# Patient Record
Sex: Female | Born: 1965 | Hispanic: No | Marital: Married | Smoking: Never smoker
Health system: Southern US, Community
[De-identification: ages and names within clinical notes are randomized; demographics above are authoritative.]

## PROBLEM LIST (undated history)

## (undated) DIAGNOSIS — M1990 Unspecified osteoarthritis, unspecified site: Secondary | ICD-10-CM

## (undated) DIAGNOSIS — F419 Anxiety disorder, unspecified: Secondary | ICD-10-CM

## (undated) DIAGNOSIS — S83206A Unspecified tear of unspecified meniscus, current injury, right knee, initial encounter: Secondary | ICD-10-CM

## (undated) HISTORY — DX: Anxiety disorder, unspecified: F41.9

---

## 1998-08-08 HISTORY — PX: AUGMENTATION MAMMAPLASTY: SUR837

## 2004-06-08 HISTORY — PX: BREAST SURGERY: SHX581

## 2005-03-09 ENCOUNTER — Other Ambulatory Visit: Admission: RE | Admit: 2005-03-09 | Discharge: 2005-03-09 | Payer: Self-pay | Admitting: Obstetrics and Gynecology

## 2006-04-25 ENCOUNTER — Other Ambulatory Visit: Admission: RE | Admit: 2006-04-25 | Discharge: 2006-04-25 | Payer: Self-pay | Admitting: Obstetrics & Gynecology

## 2007-05-09 ENCOUNTER — Other Ambulatory Visit: Admission: RE | Admit: 2007-05-09 | Discharge: 2007-05-09 | Payer: Self-pay | Admitting: Obstetrics and Gynecology

## 2007-05-23 ENCOUNTER — Other Ambulatory Visit: Admission: RE | Admit: 2007-05-23 | Discharge: 2007-05-23 | Payer: Self-pay | Admitting: Obstetrics & Gynecology

## 2008-07-08 ENCOUNTER — Other Ambulatory Visit: Admission: RE | Admit: 2008-07-08 | Discharge: 2008-07-08 | Payer: Self-pay | Admitting: Obstetrics & Gynecology

## 2009-12-30 ENCOUNTER — Encounter: Admission: RE | Admit: 2009-12-30 | Discharge: 2009-12-30 | Payer: Self-pay | Admitting: Obstetrics and Gynecology

## 2012-12-18 ENCOUNTER — Other Ambulatory Visit: Payer: Self-pay | Admitting: *Deleted

## 2012-12-18 DIAGNOSIS — Z1231 Encounter for screening mammogram for malignant neoplasm of breast: Secondary | ICD-10-CM

## 2013-01-16 ENCOUNTER — Ambulatory Visit
Admission: RE | Admit: 2013-01-16 | Discharge: 2013-01-16 | Disposition: A | Discharge status: Home / Self Care | Payer: BC Managed Care – PPO | Source: Ambulatory Visit | Attending: *Deleted | Admitting: *Deleted

## 2013-01-16 DIAGNOSIS — Z1231 Encounter for screening mammogram for malignant neoplasm of breast: Secondary | ICD-10-CM

## 2013-02-12 ENCOUNTER — Other Ambulatory Visit: Payer: Self-pay | Admitting: Orthopedic Surgery

## 2013-02-12 ENCOUNTER — Other Ambulatory Visit: Payer: BC Managed Care – PPO

## 2013-02-12 DIAGNOSIS — M79671 Pain in right foot: Secondary | ICD-10-CM

## 2013-04-11 ENCOUNTER — Ambulatory Visit (INDEPENDENT_AMBULATORY_CARE_PROVIDER_SITE_OTHER): Payer: Managed Care, Other (non HMO) | Admitting: Sports Medicine

## 2013-04-11 ENCOUNTER — Encounter: Payer: Self-pay | Admitting: Sports Medicine

## 2013-04-11 VITALS — BP 121/80 | HR 66 | Ht 67.0 in | Wt 150.0 lb

## 2013-04-11 DIAGNOSIS — M722 Plantar fascial fibromatosis: Secondary | ICD-10-CM

## 2013-04-11 NOTE — Assessment & Plan Note (Signed)
Patient physical exam as well as ultrasound does show signs of plantar fasciitis. Patient's plantar fascia did measure 0.8 cm which is twice the upper limit of normal. Arch strap given Home exercise given and discussed icing  Patient will return again in 4-6 weeks with tennis shoes and we'll watch for gait abnormalities.

## 2013-04-11 NOTE — Progress Notes (Signed)
Chief complaint: Right heel pain  History of present illness: Patient is a 47 year old female coming in with right heel pain for 2 months duration. Patient noticed it immediately while she was playing tennis and felt a strong popliteal significant swelling on the medial inferior aspect of the heel. Patient had to retire the match secondary to the pain. Patient went and saw Dr. Dion Saucier and was diagnosed with a plantar fascial rupture. Patient was put in the [facility] and told her to advance activity as tolerated. Patient is here for further evaluation and to see what activity would be possible. Patient still states that she does have a constant dull ache on the medial aspect of the ankle. Patient is able to walk without significant trouble and is able to sleep comfortably. Patient denies any numbness or tingling or any weakness. Patient would like to start running and playing tennis again when we feel that this is acceptable.   No past medical history on file. No past surgical history on file. History  Substance Use Topics  . Smoking status: Never Smoker   . Smokeless tobacco: Never Used  . Alcohol Use: Not on file   patient does drink occasionally No family history on file.  Physical exam Blood pressure 121/80, pulse 66, height 5\' 7"  (1.702 m), weight 150 lb (68.04 kg). General: No apparent distress alert and oriented x3 mood and affect normal Respiratory: Patient's speak in full sentences and does not appear short of breath Skin: Warm dry intact with no signs of infection or rash Neuro: Cranial nerves II through XII are intact, neurovascularly intact in all extremities with 2+ DTRs and 2+ pulses. Foot exam: Bilaterally patient has significant bunions on the first toe. Patient's left foot actually has fibular deviation of toes one through 4. Patient does have mild breakdown of the transverse arch bilaterally. Patient does have or pronation of the hindfoot with standing. Right PF exam Normal  inspection with no visable or palpable fat pad atrophy. Patient is tender at medial insertion of plantar fascia into calcaneus. Trace effusion Great toe motion: lacks last 10 degrees of extension.  Musculoskeletal ultrasound was performed and interpreted by me today. Patient's plantar fascia does show some hypoechoic changes and loses normal architecture near the insertion. No true tear seen.

## 2013-04-11 NOTE — Patient Instructions (Signed)
Very nice to meet you We are giving you a lot of exercises you need to do daily.  It if fine for you to do activity in good shoes but need to ice the feet 20 minutes at least 2-3 times daily.  I think tylenol and ibuprofen could be helpful if you need it.  Come back again in 4-6 weeks with your tennis/running shoes and we will look at your running and then will see if orthotics will be helpful.

## 2013-05-09 ENCOUNTER — Ambulatory Visit (INDEPENDENT_AMBULATORY_CARE_PROVIDER_SITE_OTHER): Payer: Managed Care, Other (non HMO) | Admitting: Sports Medicine

## 2013-05-09 VITALS — BP 134/85 | Ht 67.5 in | Wt 150.0 lb

## 2013-05-09 DIAGNOSIS — M21611 Bunion of right foot: Secondary | ICD-10-CM

## 2013-05-09 DIAGNOSIS — R269 Unspecified abnormalities of gait and mobility: Secondary | ICD-10-CM

## 2013-05-09 DIAGNOSIS — M21619 Bunion of unspecified foot: Secondary | ICD-10-CM

## 2013-05-09 DIAGNOSIS — M722 Plantar fascial fibromatosis: Secondary | ICD-10-CM

## 2013-05-09 NOTE — Assessment & Plan Note (Signed)
We will follow these and recheck after about 6-8 weeks to see if the orthotics are taken pressure off the great toe

## 2013-05-09 NOTE — Assessment & Plan Note (Signed)
Continue on stretches and a home exercise program  Use her arch strap standing too long  Use either custom orthotics or shoes with good arch

## 2013-05-09 NOTE — Progress Notes (Signed)
Patient ID: [name] Arroyo, female   DOB: 10-Sep-1966, 47 y.o.   MRN: [id number]  Patient with right-sided plantar fascial pain She felt a pop and was seen before seeing [place] with bruising of the heel and felt to have a partial tear When we saw her recently we noted that she had some hypoechoic change at the medial plantar fascia but generally the signs of tearing had resolved She continues to have some right heel pain but it's not severe She has been able to run up to 6 miles with walking ever 5-6 minutes for 1-2 minutes She has not returned to playing tennis because that's where she hurt her plantar fasciaShe does use an arch strap when standing or walking  On her last evaluation we noted significant bunions and hallux rigidus  For this reason we felt that she needed to be put in a custom orthotic for running and sports  She returns for these  Physical examination Patient is physically fit  Moderate tenderness is present at the insertion of the medial plantar fascia to the calcaneus on the right foot Bilateral first MTP joints show significant arthritic change with spurring Great toe extension is only 10 on the right The left great toe extension is only about 15 Flexion is slightly limited as well Standing she shows significant loss of her longitudinal arch  Walking gait shows significant pronation

## 2013-05-09 NOTE — Assessment & Plan Note (Signed)
Patient was fitted for a : standard, cushioned, semi-rigid orthotic. The orthotic was heated and afterward the patient stood on the orthotic blank positioned on the orthotic stand. The patient was positioned in subtalar neutral position and 10 degrees of ankle dorsiflexion in a weight bearing stance. After completion of molding, a stable base was applied to the orthotic blank. The blank was ground to a stable position for weight bearing. Size: 9 red EVA Base: blue EVA Posting: first ray posts Additional orthotic padding: none  We spent 45 minutes molding orthotics and then adding first ray posting to get her into a neutral position. At completion of this process she was able to run with neutral foot strike and a normal degree of pronation

## 2013-06-13 ENCOUNTER — Ambulatory Visit: Payer: Managed Care, Other (non HMO) | Admitting: Sports Medicine

## 2013-08-15 ENCOUNTER — Ambulatory Visit (INDEPENDENT_AMBULATORY_CARE_PROVIDER_SITE_OTHER): Payer: Managed Care, Other (non HMO) | Admitting: Sports Medicine

## 2013-08-15 VITALS — BP 110/76 | Ht 67.0 in | Wt 150.0 lb

## 2013-08-15 DIAGNOSIS — M76821 Posterior tibial tendinitis, right leg: Secondary | ICD-10-CM

## 2013-08-15 DIAGNOSIS — M76829 Posterior tibial tendinitis, unspecified leg: Secondary | ICD-10-CM

## 2013-08-15 MED ORDER — NITROGLYCERIN 0.2 MG/HR TD PT24: 1.0000 | MEDICATED_PATCH | Freq: Every day | TRANSDERMAL | Status: DC

## 2013-08-15 NOTE — Patient Instructions (Addendum)
Thank you for coming in today Do heel raises with toes turned in 3 x 20 daily Try the compression sleeve during the day, remove at night Nitroglycerin patch Follow-up 1 month   Nitroglycerin Protocol   Apply 1/4 nitroglycerin patch to affected area daily.  Change position of patch within the affected area every 24 hours.  You may experience a headache during the first 1-2 weeks of using the patch, these should subside.  If you experience headaches after beginning nitroglycerin patch treatment, you may take your preferred over the counter pain reliever.  Another side effect of the nitroglycerin patch is skin irritation or rash related to patch adhesive.  Please notify our office if you develop more severe headaches or rash, and stop the patch.  Tendon healing with nitroglycerin patch may require 12 to 24 weeks depending on the extent of injury.  Men should not use if taking Viagra, Cialis, or Levitra.   Do not use if you have migraines or rosacea.

## 2013-08-15 NOTE — Progress Notes (Signed)
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 She states that she previously had a partial rupture of her plantar fashion and had a lot of arch pain but that has resolved with custom orthotics. However, for the last 6 weeks she has had worsening medial ankle pain. She does continue to run and play tennis. She states that the pain in her medial ankle is worsening. In the morning she feels relatively okay but he comes more severe during the course of the day. She notes a lot of swelling over the medial ankle and also some warmth. At night the pain is throbbing in wakes her up.  ROS: As above in the HPI. All other systems are stable or negative.  OBJECTIVE: APPEARANCE:  Patient in no acute distress.The patient appeared well nourished and normally developed. HEENT: No scleral icterus. Conjunctiva non-injected Resp: Non labored Skin: No rash MSK:  Right Ankle: No visible erythema or swelling. Range of motion is full in all directions. Strength is 5/5 in all directions. Tenderness over the posterior tibial tendon with some mild puffiness in this area Pain with resisted plantarflexion and eversion Normal posterior tib function on heel raise Stable lateral and medial ligaments; squeeze test and kleiger test unremarkable; Talar dome nontender; No pain at base of 5th MT; No tenderness over cuboid; Able to walk 4 steps.   MSK [place]: Limited ultrasound of the medial ankle was performed in transverse and longitudinal views. There was noted to be hypoechoic changes surrounding the posterior tibialis tendon. Also noted is an accessory flexor digitorum which could potentially be causing crowding in the tarsal tunnel. There is some hypoechoic change around this tendon as well. On longitudinal views following the posterior tibialis tendon to the attachment site at the navicular there is a  hyperechoic fragment off of the distal navicular consistent with a probable avulsion. This is present on both transverse and longitudinal views. Again noted is the extensor digitorum in this place with some hypoechoic change around the area. There is increased signal on Doppler at the area of the navicular and posterior tibialis insertion.   ASSESSMENT: #1. 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 She was given exercises including heel raises with foot inversion. We'll place her in a body helix ankle sleeve to wear during the day. She may continue to do her sporting activities as tolerated. She does seem to have good correction of her foot position in her custom orthotics and she will continue these. Followup in one month.

## 2013-08-28 ENCOUNTER — Ambulatory Visit: Payer: Managed Care, Other (non HMO) | Admitting: Sports Medicine

## 2013-09-13 ENCOUNTER — Encounter: Payer: Self-pay | Admitting: Sports Medicine

## 2013-09-13 ENCOUNTER — Ambulatory Visit (INDEPENDENT_AMBULATORY_CARE_PROVIDER_SITE_OTHER): Payer: Managed Care, Other (non HMO) | Admitting: Sports Medicine

## 2013-09-13 VITALS — BP 125/80 | Ht 67.5 in | Wt 150.0 lb

## 2013-09-13 DIAGNOSIS — M21619 Bunion of unspecified foot: Secondary | ICD-10-CM

## 2013-09-13 DIAGNOSIS — R269 Unspecified abnormalities of gait and mobility: Secondary | ICD-10-CM

## 2013-09-13 DIAGNOSIS — M25559 Pain in unspecified hip: Secondary | ICD-10-CM

## 2013-09-13 DIAGNOSIS — M21611 Bunion of right foot: Secondary | ICD-10-CM

## 2013-09-13 DIAGNOSIS — M25569 Pain in unspecified knee: Secondary | ICD-10-CM

## 2013-09-13 DIAGNOSIS — M2550 Pain in unspecified joint: Secondary | ICD-10-CM

## 2013-09-13 DIAGNOSIS — G8929 Other chronic pain: Secondary | ICD-10-CM | POA: Insufficient documentation

## 2013-09-13 NOTE — Assessment & Plan Note (Signed)
Based on her examination I doubt that we will find significant changes to suggest rheumatoid arthritis  However, with her positive family history I think we will go ahead and screen with a CBC, sedimentation rate, rheumatoid factor and ANA  She is okay to keep up her activity

## 2013-09-13 NOTE — Assessment & Plan Note (Signed)
Gait seems to be improving and she has less posterior tibialis pain today as well

## 2013-09-13 NOTE — Progress Notes (Signed)
  Subjective:    Patient ID: [name] Arroyo, female    DOB: 15-Nov-1965, 47 y.o.   MRN: [id number]  HPI  Pt presents to clinic for f/u of posterior tibialis tendinopathy with small avulsion off navicular which is improving.  Used NTG sparingly.  Doing home exercises regularly.  Playing tennis and running without significant pain.  Her main concern today is various joint pains that she is having in wrists, ankles, knees.  She does notice soreness in joints especially in the mornings, or after a period of rest.  Family hx of Rheumatoid Arthritis, Myesthenia Gravis, and MS   Since both her hands and wrists 8 most morning she wonders about checking for possible rheumatoid arthritis  Review of Systems     Objective:   Physical Exam  No acute distress  Post tib area looks better today, no swelling  Normal ankle ligaments  Hands Normal motion No effusion No MCP swelling Has changes at MCP of bilat thumbs Flexor nodule on flexor tendons on index finger on lt and 3rd finger on rt  Normal ankle and knee motion bilat No swelling in ankles and knees  Bilateral early bunions bilat Loss of long arch       Assessment & Plan:

## 2013-09-13 NOTE — Assessment & Plan Note (Signed)
Improvement of symptoms using orthotics

## 2013-10-16 ENCOUNTER — Telehealth: Payer: Self-pay | Admitting: Obstetrics & Gynecology

## 2013-10-16 NOTE — Telephone Encounter (Signed)
Pt is calling to ask a question for her daughter. She states her daughter is away at college and her roommate was just diagnosed with Type 1 Herpes. Her daughter has drank from her friends cup and now she is concerned that she may be at risk for herpes.

## 2013-10-16 NOTE — Telephone Encounter (Signed)
Spoke with pt who wants to find out more about HSV 1 to hopefully allay daughter's concerns. Daughter has never been sexually active, has never had a cold sore or any genital sore. Daughter's  friend has recently been diagnosed with HSV1 and had a "genital outbreak for the first time."  Pt's daughter has shared a soda with her friend and is worried. Friend has not had a sore on her mouth. Pt just wanted to know the risks and how long it takes for any exposure to show up on a titer test, if daughter was to have it done. Tried to reassure pt that risk is very low, especially since friend did not even have a cold sore at the time they shared a soda.  Any other recommendation?

## 2013-10-17 NOTE — Telephone Encounter (Signed)
Called patient.  Answered all questions regarding HSV1, genital warts.  Informed pt this is genital warts not facial herpes.  Spread by skin to skin contact only. Very grateful for phone call. Encounter closed.

## 2013-12-18 ENCOUNTER — Encounter: Payer: Self-pay | Admitting: Sports Medicine

## 2013-12-18 ENCOUNTER — Ambulatory Visit (INDEPENDENT_AMBULATORY_CARE_PROVIDER_SITE_OTHER): Payer: Managed Care, Other (non HMO) | Admitting: Sports Medicine

## 2013-12-18 VITALS — BP 105/73 | HR 67 | Ht 67.0 in | Wt 150.0 lb

## 2013-12-18 DIAGNOSIS — G5762 Lesion of plantar nerve, left lower limb: Secondary | ICD-10-CM

## 2013-12-18 DIAGNOSIS — G5760 Lesion of plantar nerve, unspecified lower limb: Secondary | ICD-10-CM

## 2013-12-18 DIAGNOSIS — M25579 Pain in unspecified ankle and joints of unspecified foot: Secondary | ICD-10-CM

## 2013-12-18 NOTE — Progress Notes (Signed)
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 She states she has always had some level of discomfort is result of her severe bunion. However her pain has been different for the last month. She notes increased forefoot pain and swelling over both the dorsal and plantar aspect of her foot. She feels like she is walking on a rock. She gets pain that shoots into her toes and also has numbness. She thinks she has a Morton's neuroma. She denies any change in training patterns. She is running about 20 miles a week.  ROS: As above in the HPI. All other systems are stable or negative.  OBJECTIVE: APPEARANCE:  Patient in no acute distress.The patient appeared well nourished and normally developed. HEENT: No scleral icterus. Conjunctiva non-injected Resp: Non labored Skin: No rash MSK:  Foot exam:  - On inspection, there is noted to be mild puffiness of the dorsal forefoot on the left.  - With barefoot walking patient is noted to have antalgic gait.  - There is tenderness to palpation over the third distal metatarsal greater than second distal metatarsal but no other tenderness to palpation.  - Pain with metatarsal squeeze - Strength is 5 out of 5 in ankle and foot.  - Neurovascular status normal.   MSK US: Limited ultrasound of the right foot was performed today. Second and third metatarsals visualized in transverse and longitudinal view with no evidence of disruption of the cortex or surrounding hypoechoic change to suggest stress fracture. I could not confidently visualized a Morton's neuroma today.   ASSESSMENT: #1. Left forefoot pain consistent with Morton's neuroma in the setting of severe bunion   PLAN: We again discussed with her that we will not recommend that she have surgical correction of the bunion until her pain becomes severe enough that she is unable to participate in her normal activities.  For her neuroma, the best option at this point is to try a metatarsal pad. A medium metatarsal pad was added to her left shoe. She will return to see us in 3 weeks. If she is still having significant symptoms at that time would consider injection. We would be happy to recommend a foot surgeon in the future if her bunion becomes painful enough that she is ready to move forward with surgery. Followup 3 weeks.

## 2013-12-18 NOTE — Patient Instructions (Signed)
Thank you for coming in today  We think you have a mortons neuroma Try metatarsal pad We will try injection if this does not help Get foot xrays  Followup 3 weeks

## 2013-12-28 ENCOUNTER — Ambulatory Visit
Admission: RE | Admit: 2013-12-28 | Discharge: 2013-12-28 | Disposition: A | Discharge status: Home / Self Care | Payer: Managed Care, Other (non HMO) | Source: Ambulatory Visit | Attending: Family Medicine | Admitting: Family Medicine

## 2013-12-28 DIAGNOSIS — M25579 Pain in unspecified ankle and joints of unspecified foot: Secondary | ICD-10-CM

## 2013-12-28 DIAGNOSIS — G5762 Lesion of plantar nerve, left lower limb: Secondary | ICD-10-CM

## 2014-01-07 ENCOUNTER — Encounter: Payer: Self-pay | Admitting: Sports Medicine

## 2014-01-07 ENCOUNTER — Ambulatory Visit (INDEPENDENT_AMBULATORY_CARE_PROVIDER_SITE_OTHER): Payer: Managed Care, Other (non HMO) | Admitting: Sports Medicine

## 2014-01-07 VITALS — BP 127/82 | HR 65 | Ht 67.0 in | Wt 150.0 lb

## 2014-01-07 DIAGNOSIS — G5760 Lesion of plantar nerve, unspecified lower limb: Secondary | ICD-10-CM

## 2014-01-07 DIAGNOSIS — M21611 Bunion of right foot: Secondary | ICD-10-CM

## 2014-01-07 DIAGNOSIS — G5762 Lesion of plantar nerve, left lower limb: Secondary | ICD-10-CM

## 2014-01-07 DIAGNOSIS — M21612 Bunion of left foot: Secondary | ICD-10-CM

## 2014-01-07 DIAGNOSIS — M21619 Bunion of unspecified foot: Secondary | ICD-10-CM

## 2014-01-07 NOTE — Patient Instructions (Signed)
You have been scheduled for an appointment with Dr. Victorino DikeHewitt on 02/18/14 at 8:15 am.   [facility] [address] [place] [id number] [phone]

## 2014-01-07 NOTE — Progress Notes (Signed)
   Subjective:    Patient ID: [name] Arroyo, female    DOB: May 11, 1966, 48 y.o.   MRN: [id number]  HPI Patient comes in today for followup on left foot pain. Overall, symptoms are the same despite using a metatarsal pad. X-rays of her left foot show a rather prominent hallux valgus deformity with bunion formation. Nothing acute. She continues to complain of pain across her forefoot. Numbness  Into the second toe as well.    Review of Systems     Objective:   Physical Exam Well-developed, well-nourished. No acute distress.  Left foot: There is tenderness to palpation between the second and third metatarsal heads. No palpable neuroma. No tenderness over the metatarsal head. No tenderness to palpation across the dorsum of the foot. No pain with metatarsal squeeze. No soft tissue swelling. Once again appreciated is a prominent bunion of the great toe. Patient walks without a limp.  X-rays are as above      Assessment & Plan:  Chronic left foot pain secondary to bunion deformity now with probable Morton's neuroma  Patient is interested in discussing surgical options for her foot. This is a chronic issue for her and she is frustrated by the disability that is causing her. I've arranged for consultation with Dr. Candice CampHewiltt to take place the first week of April. Patient will be leaving on March 16 [place] and I discussed the possibility of a cortisone injection for her possible neuroma if symptoms warrant. If so, she will return to the office approximately one week prior to her trip for the injection. Otherwise, I will defer further workup and treatment of her left foot issue to Dr.Hewitt.

## 2014-01-09 ENCOUNTER — Other Ambulatory Visit: Payer: Self-pay | Admitting: Nurse Practitioner

## 2014-01-09 NOTE — Telephone Encounter (Signed)
Pt needs to schedule her Annual exam

## 2014-01-09 NOTE — Telephone Encounter (Signed)
Pt needs to schedule Annual Exam

## 2014-02-06 ENCOUNTER — Encounter: Payer: Self-pay | Admitting: Sports Medicine

## 2014-02-06 ENCOUNTER — Ambulatory Visit (INDEPENDENT_AMBULATORY_CARE_PROVIDER_SITE_OTHER): Payer: Managed Care, Other (non HMO) | Admitting: Sports Medicine

## 2014-02-06 VITALS — BP 121/77 | Ht 67.0 in | Wt 150.0 lb

## 2014-02-06 DIAGNOSIS — G5762 Lesion of plantar nerve, left lower limb: Secondary | ICD-10-CM

## 2014-02-06 DIAGNOSIS — G5760 Lesion of plantar nerve, unspecified lower limb: Secondary | ICD-10-CM

## 2014-02-06 MED ORDER — METHYLPREDNISOLONE ACETATE 40 MG/ML IJ SUSP
40.0000 mg | Freq: Once | INTRAMUSCULAR | Status: AC
  Administered 2014-02-06: 40 mg via INTRALESIONAL

## 2014-02-06 NOTE — Progress Notes (Signed)
   Subjective:    Patient ID: [name] Arroyo, female    DOB: 11-21-1965, 48 y.o.   MRN: [id number]  HPI Patient is here for a cortisone injection into her left foot. She was previously diagnosed with a Morton's neuroma of the left foot. This is a clinical diagnosis. She is having pain and swelling in between the second and third metatarsal heads. Swelling is both dorsal and medial and is associated with numbness and tingling into her toes. She was previously fitted with a metatarsal pad which helps somewhat but did not result in complete symptom relief. She has an appointment with Dr.Hewitt later this month. We had discussed previously doing a cortisone injection if her symptoms warranted.    Review of Systems     Objective:   Physical Exam Well-developed, well-nourished. No acute distress. Awake alert and oriented x3. Vital signs reviewed.  Left foot: Once again noted is complete collapse of the transverse arch with a bunion deformity. There is soft tissue swelling between the metatarsal heads on the dorsum of the foot (second and third metatarsal heads). Tenderness to palpation and a positive Mulder sign. She also has some mild soft tissue swelling along the plantar aspect of her foot, typically over the second metatarsal head. Minimal tenderness to palpation here however. There is noticeable callus formation. She is neurovascularly intact distally and is walking without a limp.       Assessment & Plan:  1. Left foot pain secondary to Morton's neuroma 2. Metatarsalgia 3. Bunion deformity 4. Transverse arch collapse  For diagnostic as well as therapeutic reasons I injected the Morton's neuroma in her left foot utilizing a dorsal approach. Risks and benefits of the procedure were explained prior to injection including the risk of hypopigmentation. Patient tolerated this without difficulty. She will continue to wear her sports insoles with metatarsal pads in a shoe with a wide toe box and  will see Dr.Hewitt as scheduled later this month. Followup with me when necessary.  Consent obtained and verified. Time-out conducted. Noted no overlying erythema, induration, or other signs of local infection. Skin prepped in a sterile fashion. Topical analgesic spray: Ethyl chloride. Joint: left foot mortons neuroma (2nd webspace between 2nd and 3rd metatarsal heads) Needle: 25g 1.5 inch Completed without difficulty. Meds: 1cc depomedrol (40mg ), 1cc 1% xylocaine  Advised to call if fevers/chills, erythema, induration, drainage, or persistent bleeding.

## 2014-02-08 ENCOUNTER — Other Ambulatory Visit: Payer: Self-pay | Admitting: Certified Nurse Midwife

## 2014-02-11 ENCOUNTER — Other Ambulatory Visit: Payer: Self-pay | Admitting: Certified Nurse Midwife

## 2014-02-11 NOTE — Telephone Encounter (Signed)
AEX schedule for 03/04/14 @ 9:00 with PG Ocella #28/0refills sent in to last patient until AEX

## 2014-02-12 NOTE — Telephone Encounter (Signed)
Last AEX 12/2012 AEX appt scheduled for 03/04/2014 Last refill 01/09/2014 #30/0 refills.  Please approve or deny Rx.

## 2014-02-28 ENCOUNTER — Encounter: Payer: Self-pay | Admitting: Nurse Practitioner

## 2014-03-04 ENCOUNTER — Ambulatory Visit: Payer: Self-pay | Admitting: Nurse Practitioner

## 2014-03-11 ENCOUNTER — Telehealth: Payer: Self-pay | Admitting: Nurse Practitioner

## 2014-03-11 NOTE — Telephone Encounter (Signed)
[facility]  Refill needed for birth control. AEX 04/08/14.

## 2014-03-12 ENCOUNTER — Ambulatory Visit: Payer: Self-pay | Admitting: Nurse Practitioner

## 2014-03-12 MED ORDER — DROSPIRENONE-ETHINYL ESTRADIOL 3-0.03 MG PO TABS: 1.0000 | ORAL_TABLET | Freq: Every day | ORAL | Status: DC

## 2014-03-12 NOTE — Telephone Encounter (Signed)
Last AEX 12/2012 Last refill 02/11/2014 #28/0    Will refill once until appt 04/2014  - Patient notified.

## 2014-03-13 ENCOUNTER — Other Ambulatory Visit: Payer: Self-pay | Admitting: Certified Nurse Midwife

## 2014-03-13 NOTE — Telephone Encounter (Signed)
Deb please see request for refill on Zoloft

## 2014-03-13 NOTE — Telephone Encounter (Signed)
Last AEX 12/2012  Last refill 02/11/14 #30/0 refills.  DL - No more refills after this one unless seen in office  Next appt 04/08/14  Please approve or deny Rx.

## 2014-03-14 ENCOUNTER — Other Ambulatory Visit: Payer: Self-pay

## 2014-03-14 DIAGNOSIS — Z1231 Encounter for screening mammogram for malignant neoplasm of breast: Secondary | ICD-10-CM

## 2014-03-17 NOTE — Telephone Encounter (Signed)
Deb see refill request for Zoloft- you may have already done it

## 2014-04-08 ENCOUNTER — Ambulatory Visit (INDEPENDENT_AMBULATORY_CARE_PROVIDER_SITE_OTHER): Payer: Managed Care, Other (non HMO) | Admitting: Nurse Practitioner

## 2014-04-08 ENCOUNTER — Encounter: Payer: Self-pay | Admitting: Nurse Practitioner

## 2014-04-08 ENCOUNTER — Ambulatory Visit
Admission: RE | Admit: 2014-04-08 | Discharge: 2014-04-08 | Disposition: A | Discharge status: Home / Self Care | Payer: Managed Care, Other (non HMO) | Source: Ambulatory Visit

## 2014-04-08 VITALS — BP 120/84 | HR 56 | Resp 16

## 2014-04-08 DIAGNOSIS — M2550 Pain in unspecified joint: Secondary | ICD-10-CM

## 2014-04-08 DIAGNOSIS — Z1231 Encounter for screening mammogram for malignant neoplasm of breast: Secondary | ICD-10-CM

## 2014-04-08 DIAGNOSIS — Z01419 Encounter for gynecological examination (general) (routine) without abnormal findings: Secondary | ICD-10-CM

## 2014-04-08 DIAGNOSIS — Z0000 Encounter for general adult medical examination without abnormal findings: Secondary | ICD-10-CM

## 2014-04-08 LAB — COMPREHENSIVE METABOLIC PANEL
ALK PHOS: 54 U/L (ref 39–117)
ALT: 13 U/L (ref 0–35)
AST: 24 U/L (ref 0–37)
Albumin: 4 g/dL (ref 3.5–5.2)
BILIRUBIN TOTAL: 0.6 mg/dL (ref 0.2–1.2)
BUN: 14 mg/dL (ref 6–23)
CALCIUM: 9.6 mg/dL (ref 8.4–10.5)
CO2: 28 meq/L (ref 19–32)
Chloride: 101 mEq/L (ref 96–112)
Creat: 0.9 mg/dL (ref 0.50–1.10)
GLUCOSE: 98 mg/dL (ref 70–99)
POTASSIUM: 5 meq/L (ref 3.5–5.3)
SODIUM: 136 meq/L (ref 135–145)
Total Protein: 6.6 g/dL (ref 6.0–8.3)

## 2014-04-08 LAB — LIPID PANEL
CHOLESTEROL: 247 mg/dL — AB (ref 0–200)
HDL: 91 mg/dL (ref >39)
LDL Cholesterol: 136 mg/dL — ABNORMAL HIGH (ref 0–99)
TRIGLYCERIDES: 99 mg/dL (ref <150)
Total CHOL/HDL Ratio: 2.7 Ratio
VLDL: 20 mg/dL (ref 0–40)

## 2014-04-08 LAB — SEDIMENTATION RATE: SED RATE: 1 mm/h (ref 0–22)

## 2014-04-08 LAB — POCT URINALYSIS DIPSTICK
Bilirubin, UA: NEGATIVE
Blood, UA: NEGATIVE
GLUCOSE UA: NEGATIVE
Ketones, UA: NEGATIVE
LEUKOCYTES UA: NEGATIVE
NITRITE UA: NEGATIVE
PH UA: 8
Protein, UA: NEGATIVE
Urobilinogen, UA: NEGATIVE

## 2014-04-08 LAB — RHEUMATOID FACTOR

## 2014-04-08 LAB — TSH: TSH: 2.752 u[IU]/mL (ref 0.350–4.500)

## 2014-04-08 MED ORDER — DROSPIRENONE-ETHINYL ESTRADIOL 3-0.03 MG PO TABS: 1.0000 | ORAL_TABLET | Freq: Every day | ORAL | Status: DC

## 2014-04-08 NOTE — Progress Notes (Signed)
Patient ID: [name] Arroyo, female   DOB: 08-Jan-1966, 48 y.o.   MRN: [id number] 48 y.o. [id number] Married Caucasian Fe here for annual exam.  She will be having left foot surgery this fall with extensive repair of bunion and 2 & 3 rd metatarsal.  Needs labs for Dr.Hewitt at [facility]. Menses are normal and last for 3-4 days.  Patient's last menstrual period was 04/02/2014.          Sexually active: yes  The current method of family planning is OCP (estrogen/progesterone).    Exercising: yes  running, tennis and walking.  Pt has recently started a new 30 day challenge. Smoker:  no  Health Maintenance: Pap:  12/16/11, WNL, neg HR HPV MMG:  01/18/13, Bi-Rads 1: negative 0- scheduled today TDaP:  07/08/08 Labs:  HB:  13.4  Urine:   Negative yellow   reports that she has never smoked. She has never used smokeless tobacco. She reports that she drinks about 3.5 - 7 ounces of alcohol per week. She reports that she does not use illicit drugs.  History reviewed. No pertinent past medical history.  Past Surgical History  Procedure Laterality Date  . Augmentation mammaplasty Bilateral 10/99    revision 8/05    Current Outpatient Prescriptions  Medication Sig Dispense Refill  . drospirenone-ethinyl estradiol (OCELLA) 3-0.03 MG tablet Take 1 tablet by mouth daily.  3 Package  3  . sertraline (ZOLOFT) 50 MG tablet TAKE 1/2-1 TABLET BY MOUTH DAILY  30 tablet  0   No current facility-administered medications for this visit.    Family History  Problem Relation Age of Onset  . Hyperlipidemia Mother   . Hyperlipidemia Father   . Colon cancer Paternal Grandfather     ROS:  Pertinent items are noted in HPI.  Otherwise, a comprehensive ROS was negative.  Exam:   BP 120/84  Pulse 56  Resp 16  LMP 04/02/2014    Ht Readings from Last 3 Encounters:  02/06/14 5\' 7"  (1.702 m)  01/07/14 5\' 7"  (1.702 m)  12/18/13 5\' 7"  (1.702 m)    General appearance: alert, cooperative and appears stated  age Head: Normocephalic, without obvious abnormality, atraumatic Neck: no adenopathy, supple, symmetrical, trachea midline and thyroid normal to inspection and palpation Lungs: clear to auscultation bilaterally Breasts: normal appearance, no masses or tenderness, positive findings: implants bilaterally Heart: regular rate and rhythm Abdomen: soft, non-tender; no masses,  no organomegaly Extremities: extremities normal, atraumatic, no cyanosis or edema Skin: Skin color, texture, turgor normal. No rashes or lesions Lymph nodes: Cervical, supraclavicular, and axillary nodes normal. No abnormal inguinal nodes palpated Neurologic: Grossly normal   Pelvic: External genitalia:  no lesions              Urethra:  normal appearing urethra with no masses, tenderness or lesions              Bartholin's and Skene's: normal                 Vagina: normal appearing vagina with normal color and discharge, no lesions              Cervix: anteverted              Pap taken: no Bimanual Exam:  Uterus:  normal size, contour, position, consistency, mobility, non-tender              Adnexa: no mass, fullness, tenderness  Rectovaginal: Confirms               Anus:  normal sphincter tone, no lesions  A:  Well Woman with normal exam  Contraception   S/P breast augmentation  Labs per request  P:   Reviewed health and wellness pertinent to exam  Pap smear not taken today  Mammogram is due today at noon  Refill on Ocella for a year  Counseled on breast self exam, mammography screening, use and side effects of OCP's, adequate intake of calcium and vitamin D, diet and exercise return annually or prn  An After Visit Summary was printed and given to the patient.

## 2014-04-08 NOTE — Patient Instructions (Signed)

## 2014-04-09 LAB — HEMOGLOBIN, FINGERSTICK: HEMOGLOBIN, FINGERSTICK: 13.4 g/dL (ref 12.0–16.0)

## 2014-04-10 ENCOUNTER — Other Ambulatory Visit: Payer: Self-pay | Admitting: Certified Nurse Midwife

## 2014-04-10 NOTE — Telephone Encounter (Signed)
AEX 04/08/14 Last refill 03/14/14 #30/0 refills  Please approve or deny Rx.

## 2014-04-10 NOTE — Telephone Encounter (Signed)
Patty please review for consent to refill

## 2014-04-12 NOTE — Progress Notes (Signed)
Encounter reviewed by Dr. Arisa Congleton Silva.  

## 2014-07-30 HISTORY — PX: FOOT SURGERY: SHX648

## 2014-09-09 ENCOUNTER — Encounter: Payer: Self-pay | Admitting: Nurse Practitioner

## 2014-10-25 ENCOUNTER — Ambulatory Visit (INDEPENDENT_AMBULATORY_CARE_PROVIDER_SITE_OTHER): Payer: Managed Care, Other (non HMO) | Admitting: Sports Medicine

## 2014-10-25 ENCOUNTER — Encounter: Payer: Self-pay | Admitting: Sports Medicine

## 2014-10-25 VITALS — BP 112/63 | Ht 67.0 in | Wt 150.0 lb

## 2014-10-25 DIAGNOSIS — M722 Plantar fascial fibromatosis: Secondary | ICD-10-CM

## 2014-10-25 DIAGNOSIS — M21612 Bunion of left foot: Secondary | ICD-10-CM

## 2014-10-25 DIAGNOSIS — R269 Unspecified abnormalities of gait and mobility: Secondary | ICD-10-CM

## 2014-10-25 DIAGNOSIS — M2012 Hallux valgus (acquired), left foot: Secondary | ICD-10-CM

## 2014-10-25 DIAGNOSIS — M21611 Bunion of right foot: Secondary | ICD-10-CM

## 2014-10-25 DIAGNOSIS — M2011 Hallux valgus (acquired), right foot: Secondary | ICD-10-CM

## 2014-10-25 NOTE — Progress Notes (Signed)
  [name] Arroyo - [age] y.o. female MRN [id number]  Date of birth: 09/10/1966  SUBJECTIVE:  Including CC & ROS.  Patient is a 48 year old Caucasian female who presents today for gait analysis and orthotics for chronic foot pain. Patient has a history of bilateral bunions that was surgically corrected on her left foot along with fusion of her second toe. Patient reports her operation occurred in September. She currently has been weightbearing for the last several weeks and eager to start returning to running in the future. Patient also reported that during her surgery the tendon that it controls her left great toe and extension was severed and thus she has loss of full extension. Patient denies any significant pain in her foot currently does have some residual swelling since her surgery.   ROS: Review of systems otherwise negative except for information present in HPI  HISTORY: Past Medical, Surgical, Social, and Family History Reviewed & Updated per EMR. Pertinent Historical Findings include: Recent bunionectomy Gait abnormalities History plantar fasciitis  PHYSICAL EXAM:  VS: BP:112/63 mmHg  HR: bpm  TEMP: ( )  RESP:   HT:5\' 7"  (170.2 cm)   WT:150 lb (68.04 kg)  BMI:23.5 FOOT EXAM:  General: well nourished Skin of LE: warm; dry, no rashes, lesions, ecchymosis or erythema. Vascular: Dorsal pedal pulses 2+ bilaterally Neurologically: Sensation to light touch lower extremities equal and intact bilaterally.  Observation - no ecchymosis, no edema, or hematoma present  Palpation: No TTP Normal ankle motion and strength bilaterally  Extension/flexion 5/5 strength bilaterally in toes Weight-bearing foot exam:  First ray: Correction of hallux valgus left mild persistent hallux valgus on the right  Second ray: Second toe on the left has been fused in rigid, on the right foot slightly longer than the first ray Longitudinal arch: Collapse of medial arch Heel position: Varus No leg length  discrepancy Gait analysis:  Striking location: Heel strike with walking Foot motion: Pronation Knee motion: Neutral Hip motion: Neutral  ASSESSMENT & PLAN: See problem based charting & AVS for pt instructions. Orthotic Fitting and Adjustment note: Patient was fitted for a : standard, cushioned, semi-rigid orthotic.  The orthotic was heated and afterward the patient stood on the orthotic blank positioned on the orthotic stand.  The patient was positioned in subtalar neutral position and 10 degrees of ankle dorsiflexion in a weight bearing stance.  After completion of molding, a stable base was applied to the orthotic blank.  The blank was ground to a stable position for weight bearing.  Size: 8 Base: Blue EVA Posting: Bilateral first ray post Additional orthotic padding: none  Greater than 50% of the patient's visit for a total of 30 minutes, was spent conducting face-to-face counseling for bilateral foot orthotics fitting and constructing

## 2014-12-03 ENCOUNTER — Ambulatory Visit: Payer: Managed Care, Other (non HMO) | Admitting: Sports Medicine

## 2015-03-14 ENCOUNTER — Other Ambulatory Visit: Payer: Self-pay | Admitting: Nurse Practitioner

## 2015-03-14 NOTE — Telephone Encounter (Signed)
Needs AEX 

## 2015-03-14 NOTE — Telephone Encounter (Signed)
Medication refill request: Ocella  Last AEX:  04/08/14 with PG  Next AEX: No AEX scheduled  Last MMG (if hormonal medication request): 04/08/14 Bi-Rads 2: Benign Refill authorized: Please advise.

## 2015-03-24 ENCOUNTER — Other Ambulatory Visit: Payer: Self-pay

## 2015-03-24 DIAGNOSIS — Z1231 Encounter for screening mammogram for malignant neoplasm of breast: Secondary | ICD-10-CM

## 2015-03-25 ENCOUNTER — Encounter: Payer: Self-pay | Admitting: Sports Medicine

## 2015-03-25 ENCOUNTER — Ambulatory Visit (INDEPENDENT_AMBULATORY_CARE_PROVIDER_SITE_OTHER): Payer: Managed Care, Other (non HMO) | Admitting: Sports Medicine

## 2015-03-25 VITALS — BP 117/54 | HR 72 | Ht 67.0 in | Wt 155.0 lb

## 2015-03-25 DIAGNOSIS — M2012 Hallux valgus (acquired), left foot: Secondary | ICD-10-CM | POA: Diagnosis not present

## 2015-03-25 DIAGNOSIS — M722 Plantar fascial fibromatosis: Secondary | ICD-10-CM

## 2015-03-25 DIAGNOSIS — R269 Unspecified abnormalities of gait and mobility: Secondary | ICD-10-CM

## 2015-03-25 DIAGNOSIS — M21611 Bunion of right foot: Secondary | ICD-10-CM

## 2015-03-25 DIAGNOSIS — M2011 Hallux valgus (acquired), right foot: Secondary | ICD-10-CM

## 2015-03-25 DIAGNOSIS — M21612 Bunion of left foot: Secondary | ICD-10-CM

## 2015-03-25 NOTE — Progress Notes (Signed)
Patient ID: [name] Arroyo, female   DOB: 09/12/1966, 49 y.o.   MRN: [id number]  BP 117/54 mmHg  Pulse 72  Ht 5\' 7"  (1.702 m)  Wt 155 lb (70.308 kg)  BMI 24.27 kg/m2  History: Had left foot surgery last September 2015 - by Dr Victorino DikeHewitt I think this has gone well with rapid healing considering degree of surgery Bunionectomy and plantar plate with screws Having some medial arch and pain in the dorsum of TMT 1 Some swelling at night and after workouts Using compression socks some Did a 4mi run/walk program yesterday  Exam: NAD BP 117/54 mmHg  Pulse 72  Ht 5\' 7"  (1.702 m)  Wt 155 lb (70.308 kg)  BMI 24.27 kg/m2   Ankle: No visible erythema or swelling. Range of motion is full in all directions. Strength is 5/5 in all directions. Stable lateral and medial ligaments; squeeze test and kleiger test unremarkable; Talar dome nontender; No pain at base of 5th MT; No tenderness over cuboid; No tenderness over N spot or navicular prominence No tenderness on posterior aspects of lateral and medial malleolus No sign of peroneal tendon subluxations; Negative tarsal tunnel tinel's   Able to walk 4 steps. Foot only has mild swelling Valgus alignment is only 10 deg on left, with shortening of left first toe Scar is healed and there are 3 palpable screws  US  The first MT looked good as does MTP 1 MTP 2 has slight dorsal swelling There is limited swelling around 2 screws on MTP 1 Everything looks intact and I do not see excessive scar tissue formation with normal appearance of soft tissue x directly around screws

## 2015-03-25 NOTE — Assessment & Plan Note (Signed)
I reassured her that I thought this was healing well  There is not excessive scar tissue  Localized swelling only over screws and this is less than 24 hrs after running  I would continue gradual return to activity  I added some scaphoid padding to orthotic today to unload a bit more pressure over MTP 1  Also find some compression socks that are comfortable and that may lessen swelling  OK to cont qod run/walk program

## 2015-04-11 ENCOUNTER — Other Ambulatory Visit: Payer: Self-pay | Admitting: Nurse Practitioner

## 2015-04-11 NOTE — Telephone Encounter (Signed)
Medication refill request: Ocella  Last AEX:  04/08/14 with PG  Next AEX: 06/02/15 with PG  Last MMG (if hormonal medication request): 04/08/14 bi-rads 1: negative ; scheduled for 06/02/15. Refill authorized: #28/1 rfs, please advise.  (routed to Ms. Debbie since Ms. Alexia Freestoneatty is out of the office)

## 2015-04-13 ENCOUNTER — Other Ambulatory Visit: Payer: Self-pay | Admitting: Nurse Practitioner

## 2015-04-14 NOTE — Telephone Encounter (Signed)
04/11/15 #28/1 rf was sent to [facility].

## 2015-05-07 ENCOUNTER — Other Ambulatory Visit: Payer: Self-pay | Admitting: Nurse Practitioner

## 2015-05-07 NOTE — Telephone Encounter (Signed)
Medication refill request: Zoloft, Ocella  Last AEX:  04/08/14 PG Next AEX: 07/16/15 PG Last MMG (if hormonal medication request): 04/08/14 BIRADS2:Benign . Has appt 06/02/15  Refill authorized: Ocella 04/11/15 #28tabs /1R. Zoloft 03/14/14 #30tabs/0R. Today please advise.

## 2015-06-02 ENCOUNTER — Ambulatory Visit: Payer: Managed Care, Other (non HMO) | Admitting: Nurse Practitioner

## 2015-07-16 ENCOUNTER — Ambulatory Visit (INDEPENDENT_AMBULATORY_CARE_PROVIDER_SITE_OTHER): Payer: Managed Care, Other (non HMO) | Admitting: Obstetrics and Gynecology

## 2015-07-16 ENCOUNTER — Encounter: Payer: Self-pay | Admitting: Obstetrics and Gynecology

## 2015-07-16 ENCOUNTER — Ambulatory Visit: Payer: Managed Care, Other (non HMO) | Admitting: Nurse Practitioner

## 2015-07-16 VITALS — BP 110/64 | HR 64 | Resp 14 | Ht 66.5 in | Wt 156.0 lb

## 2015-07-16 DIAGNOSIS — R631 Polydipsia: Secondary | ICD-10-CM

## 2015-07-16 DIAGNOSIS — Z8639 Personal history of other endocrine, nutritional and metabolic disease: Secondary | ICD-10-CM

## 2015-07-16 DIAGNOSIS — N3281 Overactive bladder: Secondary | ICD-10-CM | POA: Diagnosis not present

## 2015-07-16 DIAGNOSIS — F411 Generalized anxiety disorder: Secondary | ICD-10-CM | POA: Diagnosis not present

## 2015-07-16 DIAGNOSIS — Z01419 Encounter for gynecological examination (general) (routine) without abnormal findings: Secondary | ICD-10-CM

## 2015-07-16 DIAGNOSIS — Z0000 Encounter for general adult medical examination without abnormal findings: Secondary | ICD-10-CM | POA: Diagnosis not present

## 2015-07-16 DIAGNOSIS — Z124 Encounter for screening for malignant neoplasm of cervix: Secondary | ICD-10-CM

## 2015-07-16 LAB — POCT URINALYSIS DIPSTICK
BILIRUBIN UA: NEGATIVE
Blood, UA: NEGATIVE
GLUCOSE UA: NEGATIVE
Ketones, UA: NEGATIVE
LEUKOCYTES UA: NEGATIVE
NITRITE UA: NEGATIVE
Protein, UA: NEGATIVE
Urobilinogen, UA: NEGATIVE
pH, UA: 6.5

## 2015-07-16 MED ORDER — NORETHIN ACE-ETH ESTRAD-FE 1-20 MG-MCG PO TABS: 1.0000 | ORAL_TABLET | Freq: Every day | ORAL | Status: DC

## 2015-07-16 MED ORDER — SERTRALINE HCL 50 MG PO TABS: 50.0000 mg | ORAL_TABLET | Freq: Every day | ORAL | Status: DC

## 2015-07-16 NOTE — Progress Notes (Signed)
Patient ID: [name] Arroyo, female   DOB: Jun 03, 1966, 49 y.o.   MRN: [id number] 49 y.o. [id number] MarriedCaucasianF here for annual exam.  The patient c/o "an overactive bladder". She c/o urinary urgency over the last year, rare episodes of urge incontinence. Long term urinary frequency, typically voids normal amounts. 2 drinks 2 cups of coffee, then drinks water the rest of the day. If she doesn't drink her coffee, it helps some. No stress incontinence.  On OCP's, normal cycles. She has night sweats for the last 5 years. Sexually active, no pain. She feels a little bump in her vulva.     Patient's last menstrual period was 06/29/2015.          Sexually active: Yes.    The current method of family planning is OCP (estrogen/progesterone).    Exercising: Yes.    weights, strength training, running and walking 5 days a week Smoker:  no  Health Maintenance: Pap:  12-16-11 WNL NEG HR HPV History of abnormal Pap:  Yes- repeated in 6 mos was then WNL MMG:  04-08-14 WNL Colonoscopy:  N/A BMD:   N/A TDaP:  07-08-08 Gardasil: N/A   reports that she has never smoked. She has never used smokeless tobacco. She reports that she drinks about 3.5 - 7.0 oz of alcohol per week. She reports that she does not use illicit drugs.  Daughter's are 20 adn 23  History reviewed. No pertinent past medical history.  Past Surgical History  Procedure Laterality Date  . Augmentation mammaplasty Bilateral 10/99    revision 8/05  . Foot surgery Left 07-30-14    reconstructive surgery     Current Outpatient Prescriptions  Medication Sig Dispense Refill  . sertraline (ZOLOFT) 50 MG tablet Take 1 tablet (50 mg total) by mouth daily. 90 tablet 3  . norethindrone-ethinyl estradiol (JUNEL FE,GILDESS FE,LOESTRIN FE) 1-20 MG-MCG tablet Take 1 tablet by mouth daily. 3 Package 3   No current facility-administered medications for this visit.    Family History  Problem Relation Age of Onset  . Hyperlipidemia Mother   .  Hyperlipidemia Father   . Colon cancer Paternal Grandfather     Review of Systems  Constitutional: Negative.   HENT: Positive for sinus pressure.   Eyes: Negative.   Respiratory: Negative.   Cardiovascular: Negative.   Gastrointestinal: Negative.        Bloating   Endocrine: Positive for cold intolerance, heat intolerance and polydipsia.  Genitourinary: Positive for urgency and frequency.  Musculoskeletal: Negative.   Skin: Negative.   Allergic/Immunologic: Negative.   Neurological: Negative.   Psychiatric/Behavioral: Negative.   She has been checked for diabetes and TSH in the past.   Exam:   BP 110/64 mmHg  Pulse 64  Resp 14  Ht 5' 6.5" (1.689 m)  Wt 156 lb (70.761 kg)  BMI 24.80 kg/m2  LMP 06/29/2015  Weight change: @ Height:   Height: 5' 6.5" (168.9 cm)  Ht Readings from Last 3 Encounters:  07/16/15 5' 6.5" (1.689 m)  03/25/15  (1.702 m)  10/25/14  (1.702 m)    General appearance: alert, cooperative and appears stated age Head: Normocephalic, without obvious abnormality, atraumatic Neck: no adenopathy, supple, symmetrical, trachea midline and thyroid normal to inspection and palpation Lungs: clear to auscultation bilaterally Breasts: normal appearance, no masses or tenderness, bilateral breast implants Heart: regular rate and rhythm Abdomen: soft, non-tender; bowel sounds normal; no masses,  no organomegaly Extremities: extremities normal, atraumatic, no cyanosis or edema Skin: Skin color,  texture, turgor normal. No rashes or lesions Lymph nodes: Cervical, supraclavicular, and axillary nodes normal. No abnormal inguinal nodes palpated Neurologic: Grossly normal   Pelvic: External genitalia:  no lesions              Urethra:  normal appearing urethra with no masses, tenderness or lesions              Bartholins and Skenes: normal                 Vagina: normal appearing vagina with normal color and discharge, no lesions               Cervix: no lesions               Bimanual Exam:  Uterus:  normal size, contour, position, consistency, mobility, non-tender and anteverted              Adnexa: no mass, fullness, tenderness               Rectovaginal: Confirms               Anus:  normal sphincter tone, no lesions, small skin tag next to her anus (area in question, patient reasurred)  Chaperone was present for exam.  A:  Well Woman with normal exam  Overactive bladder  Anxiety, controlled with Zoloft  Contraception, will decrease to a 20 mcg pill  H/O elevated lipids with normal ratio  polydipsia    P:    Discussed bladder training, decreasing caffeine intake, we discussed PT and medication  She will call if she wants to try medication  She will return for a fasting lipid profile and HgbA1C  Pap with hpv  Mammogram  Change to a a 20 microgram pill

## 2015-07-17 LAB — URINALYSIS, MICROSCOPIC ONLY
BACTERIA UA: NONE SEEN [HPF]
CRYSTALS: NONE SEEN [HPF]
Casts: NONE SEEN [LPF]
RBC / HPF: NONE SEEN RBC/HPF (ref <=2)
WBC UA: NONE SEEN WBC/HPF (ref <=5)
Yeast: NONE SEEN [HPF]

## 2015-07-17 LAB — URINE CULTURE
Colony Count: NO GROWTH
ORGANISM ID, BACTERIA: NO GROWTH

## 2015-07-21 LAB — IPS PAP TEST WITH HPV

## 2015-07-28 ENCOUNTER — Other Ambulatory Visit: Payer: Self-pay | Admitting: Nurse Practitioner

## 2015-07-28 NOTE — Telephone Encounter (Signed)
Medication refill request: Ocella Last AEX:  07/16/15 JJ Next AEX: Not scheduled Last MMG (if hormonal medication request):  Refill authorized: please advise

## 2015-07-28 NOTE — Telephone Encounter (Signed)
I called in a years worth of loestrin 1/20. We changed her pill. Is the patient calling, or the pharmacy? Please confirm the pharmacy has the new script. Thanks

## 2015-07-28 NOTE — Telephone Encounter (Signed)
[facility]

## 2015-08-06 ENCOUNTER — Other Ambulatory Visit: Payer: Self-pay | Admitting: Nurse Practitioner

## 2015-08-06 ENCOUNTER — Other Ambulatory Visit (INDEPENDENT_AMBULATORY_CARE_PROVIDER_SITE_OTHER): Payer: Managed Care, Other (non HMO)

## 2015-08-06 DIAGNOSIS — R631 Polydipsia: Secondary | ICD-10-CM

## 2015-08-06 DIAGNOSIS — Z8639 Personal history of other endocrine, nutritional and metabolic disease: Secondary | ICD-10-CM

## 2015-08-06 DIAGNOSIS — Z0000 Encounter for general adult medical examination without abnormal findings: Secondary | ICD-10-CM

## 2015-08-06 LAB — LIPID PANEL
CHOL/HDL RATIO: 3.3 ratio (ref <=5.0)
Cholesterol: 245 mg/dL — ABNORMAL HIGH (ref 125–200)
HDL: 75 mg/dL (ref >=46)
LDL Cholesterol: 148 mg/dL — ABNORMAL HIGH (ref <130)
Triglycerides: 112 mg/dL (ref <150)
VLDL: 22 mg/dL (ref <30)

## 2015-08-06 LAB — HEMOGLOBIN A1C
HEMOGLOBIN A1C: 5.4 % (ref <5.7)
MEAN PLASMA GLUCOSE: 108 mg/dL (ref <117)

## 2015-08-06 NOTE — Telephone Encounter (Signed)
Patient Zoloft medication was sent on 07/16/15 to [facility] [place] for a #90 with 3 refills.

## 2015-09-23 ENCOUNTER — Ambulatory Visit
Admission: RE | Admit: 2015-09-23 | Discharge: 2015-09-23 | Disposition: A | Discharge status: Home / Self Care | Payer: Managed Care, Other (non HMO) | Source: Ambulatory Visit

## 2015-09-23 DIAGNOSIS — Z1231 Encounter for screening mammogram for malignant neoplasm of breast: Secondary | ICD-10-CM

## 2016-06-15 ENCOUNTER — Other Ambulatory Visit: Payer: Self-pay | Admitting: Obstetrics and Gynecology

## 2016-06-15 NOTE — Telephone Encounter (Signed)
Medication refill request: JUNEL FE 1/20 Last AEX:  07/16/15 JJ Next AEX: 08/12/16  Last MMG (if hormonal medication request): 09/23/15 BI-RADS1 negative Refill authorized: 07/16/15 #3packs w/3 refills; today #3packs w/0 refills?

## 2016-06-22 ENCOUNTER — Other Ambulatory Visit: Payer: Self-pay

## 2016-06-22 MED ORDER — SERTRALINE HCL 50 MG PO TABS: 50.0000 mg | ORAL_TABLET | Freq: Every day | ORAL | 0 refills | Status: DC

## 2016-06-22 NOTE — Telephone Encounter (Signed)
Please inform 3 month refill has been sent

## 2016-06-22 NOTE — Telephone Encounter (Signed)
90-day supply request faxed from pharmacy for Sertraline.  Medication refill request: Sertraline 50mg  Last AEX:  07/16/15 JJ Next AEX: 08/12/16  Last MMG (if hormonal medication request): 09/23/15 BIRADS 1 negative Refill authorized: 07/16/15 #90 w/3 refills; today #90 w/0? Please advise

## 2016-07-09 ENCOUNTER — Other Ambulatory Visit: Payer: Self-pay | Admitting: Nurse Practitioner

## 2016-08-12 ENCOUNTER — Encounter: Payer: Self-pay | Admitting: Obstetrics and Gynecology

## 2016-08-12 ENCOUNTER — Ambulatory Visit (INDEPENDENT_AMBULATORY_CARE_PROVIDER_SITE_OTHER): Payer: Managed Care, Other (non HMO) | Admitting: Obstetrics and Gynecology

## 2016-08-12 VITALS — BP 118/70 | HR 76 | Resp 16 | Ht 67.0 in | Wt 159.5 lb

## 2016-08-12 DIAGNOSIS — Z01419 Encounter for gynecological examination (general) (routine) without abnormal findings: Secondary | ICD-10-CM | POA: Diagnosis not present

## 2016-08-12 DIAGNOSIS — Z0000 Encounter for general adult medical examination without abnormal findings: Secondary | ICD-10-CM | POA: Diagnosis not present

## 2016-08-12 LAB — POCT URINALYSIS DIPSTICK
BILIRUBIN UA: NEGATIVE
GLUCOSE UA: NEGATIVE
KETONES UA: NEGATIVE
Leukocytes, UA: NEGATIVE
Nitrite, UA: NEGATIVE
PH UA: 7
Protein, UA: NEGATIVE
RBC UA: NEGATIVE
Urobilinogen, UA: NEGATIVE

## 2016-08-12 MED ORDER — SERTRALINE HCL 50 MG PO TABS: 50.0000 mg | ORAL_TABLET | Freq: Every day | ORAL | 3 refills | Status: DC

## 2016-08-12 MED ORDER — NORETHIN ACE-ETH ESTRAD-FE 1-20 MG-MCG PO TABS: 1.0000 | ORAL_TABLET | Freq: Every day | ORAL | 3 refills | Status: DC

## 2016-08-12 NOTE — Progress Notes (Signed)
50 y.o. [id number] MarriedCaucasianF here for annual exam.   Her overactive bladder symptoms have improved, only rarely leaked. No longer an issue.  She is on OCP's. Not always having cycles with the pill, skips cycles. Varies from minimal to moderate. Can saturate a regular tampon in 5-6 hours. Can bleed for less than a day to 6 days. No cramps. Sexually active, no pain.  Period Cycle (Days): 28 Period Duration (Days): 5-6 Period Pattern: Regular Menstrual Flow: Moderate Menstrual Control: Tampon Menstrual Control Change Freq (Hours): 4-5 hours Dysmenorrhea: (!) Mild Dysmenorrhea Symptoms: CrampingShe is doing well on the Zoloft for anxiety, thinking of cutting back. H/O PMDD in the past.  Traveling to [place] with her husband in a few weeks (his business).   Patient's last menstrual period was 07/09/2016.          Sexually active: Yes.    The current method of family planning is OCP (estrogen/progesterone) JUNEL FE Exercising: Yes.    trainer, run/walk Smoker:  no  Health Maintenance: Pap:  07/16/15 Pap Smear negative: HR HPV not detected History of abnormal Pap:  Yes repeated in 6 mos was then WNL MMG:  09/23/15 BIRADS 1 negative Colonoscopy:  never BMD:   never TDaP:  07/08/08 Gardasil: n/a   reports that she has never smoked. She has never used smokeless tobacco. She reports that she drinks about 3.5 - 7.0 oz of alcohol per week . She reports that she does not use drugs.Daughter's are 21 and 24. Younger daughter with severe OCD, doing much better. She is a IT trainer, Administrator, [employer] (works part time).   History reviewed. No pertinent past medical history.  Past Surgical History:  Procedure Laterality Date  . AUGMENTATION MAMMAPLASTY Bilateral 10/99   revision 8/05  . FOOT SURGERY Left 07-30-14   reconstructive surgery     Current Outpatient Prescriptions  Medication Sig Dispense Refill  . JUNEL FE 1/20 1-20 MG-MCG tablet TAKE 1 TABLET BY MOUTH EVERY DAY 84 tablet 0   . sertraline (ZOLOFT) 50 MG tablet Take 1 tablet (50 mg total) by mouth daily. 90 tablet 0   No current facility-administered medications for this visit.     Family History  Problem Relation Age of Onset  . Colon cancer Paternal Grandfather   . Hyperlipidemia Mother   . Hyperlipidemia Father     Review of Systems  Constitutional: Negative.   HENT: Positive for sinus pressure.   Eyes: Negative.   Respiratory: Negative.   Cardiovascular: Negative.   Gastrointestinal: Negative.   Endocrine: Negative.   Genitourinary:       Menstrual Cycle changes  Musculoskeletal: Negative.   Skin: Negative.   Allergic/Immunologic: Negative.   Neurological: Negative.   Hematological: Bruises/bleeds easily.  Psychiatric/Behavioral: Negative.     Exam:   BP 118/70 (BP Location: Right Arm, Patient Position: Sitting, Cuff Size: Normal)   Pulse 76   Resp 16   Ht 5\' 7"  (1.702 m)   Wt 159 lb 8 oz (72.3 kg)   LMP 07/09/2016   BMI 24.98 kg/m   Weight change: @WEIGHTCHANGE @ Height:   Height: 5\' 7"  (170.2 cm)  Ht Readings from Last 3 Encounters:  08/12/16 5\' 7"  (1.702 m)  07/16/15 5' 6.5" (1.689 m)  03/25/15 5\' 7"  (1.702 m)    General appearance: alert, cooperative and appears stated age Head: Normocephalic, without obvious abnormality, atraumatic Neck: no adenopathy, supple, symmetrical, trachea midline and thyroid normal to inspection and palpation Lungs: clear to auscultation bilaterally Breasts: normal  appearance, no masses or tenderness Heart: regular rate and rhythm Abdomen: soft, non-tender; bowel sounds normal; no masses,  no organomegaly Extremities: extremities normal, atraumatic, no cyanosis or edema Skin: Skin color, texture, turgor normal. No rashes or lesions Lymph nodes: Cervical, supraclavicular, and axillary nodes normal. No abnormal inguinal nodes palpated Neurologic: Grossly normal   Pelvic: External genitalia:  no lesions              Urethra:  normal appearing  urethra with no masses, tenderness or lesions              Bartholins and Skenes: normal                 Vagina: normal appearing vagina with normal color and discharge, no lesions              Cervix: no lesions               Bimanual Exam:  Uterus:  normal size, contour, position, consistency, mobility, non-tender              Adnexa: no mass, fullness, tenderness               Rectovaginal: Confirms               Anus:  normal sphincter tone, no lesions  Chaperone was present for exam.  A:  Well Woman with normal exam  Anxiety, doing well on Zoloft, may wean off  P:   Will return for fasting labs   No pap this year  Mammogram  Discussed breast self exam  Discussed calcium and vit D intake  Continue Zoloft  Continue OCP's

## 2016-08-19 ENCOUNTER — Telehealth: Payer: Self-pay | Admitting: Obstetrics and Gynecology

## 2016-08-19 NOTE — Telephone Encounter (Signed)
Patient is returning a call regarding her daughter who is a patient of the practice.  Will close encounter.

## 2016-08-19 NOTE — Telephone Encounter (Signed)
Patient states she is returning a call to CamasKaitlyn. No message found.

## 2016-09-06 ENCOUNTER — Other Ambulatory Visit: Payer: Self-pay | Admitting: Obstetrics and Gynecology

## 2016-09-17 ENCOUNTER — Other Ambulatory Visit: Payer: Self-pay | Admitting: Obstetrics and Gynecology

## 2016-09-17 DIAGNOSIS — Z1231 Encounter for screening mammogram for malignant neoplasm of breast: Secondary | ICD-10-CM

## 2016-10-25 ENCOUNTER — Ambulatory Visit
Admission: RE | Admit: 2016-10-25 | Discharge: 2016-10-25 | Disposition: A | Discharge status: Home / Self Care | Payer: Managed Care, Other (non HMO) | Source: Ambulatory Visit | Attending: Obstetrics and Gynecology | Admitting: Obstetrics and Gynecology

## 2016-10-25 DIAGNOSIS — Z1231 Encounter for screening mammogram for malignant neoplasm of breast: Secondary | ICD-10-CM

## 2016-11-03 ENCOUNTER — Telehealth: Payer: Self-pay | Admitting: *Deleted

## 2016-11-03 NOTE — Telephone Encounter (Signed)
Spoke with patient and she does not want to have the fasting labs done at this time. She says she will call back later to schedule them -eh

## 2016-11-03 NOTE — Telephone Encounter (Signed)
-----   Message from Romualdo BolkJill Evelyn Jertson, MD sent at 10/29/2016 11:35 AM EST ----- See if the patient still plans to return for fasting labs. If not please cancel the orders. Thanks, Noreene LarssonJill ----- Message ----- From: SYSTEM Sent: 10/17/2016  12:05 AM To: Romualdo BolkJill Evelyn Jertson, MD

## 2017-05-30 ENCOUNTER — Ambulatory Visit (INDEPENDENT_AMBULATORY_CARE_PROVIDER_SITE_OTHER): Payer: Managed Care, Other (non HMO) | Admitting: Sports Medicine

## 2017-05-30 VITALS — BP 126/90 | Ht 67.0 in | Wt 155.0 lb

## 2017-05-30 DIAGNOSIS — M21619 Bunion of unspecified foot: Secondary | ICD-10-CM | POA: Diagnosis not present

## 2017-05-30 NOTE — Progress Notes (Signed)
   Subjective:    Patient ID: [name] CenterEsther Arroyo, female    DOB: [birth year], 51 y.o.   MRN: [id number]  HPI cc: right foot pain  Patient presents with right foot pain secondary to bunion that she has had for many years. She had her left foot reconstructed by Dr. Victorino DikeHewitt in 2015 for bunion with good results. She is now getting similar pain in her right foot and feels like it is interfering with her exercise routine. She denies numbness/tingling in her foot. She has done injections and orthotics in past for left foot bunion without good result and prefers to forgo these interventions if surgery may be needed.  Also has had left knee cap pain occasionally and feels like her knee cap will come out of place when standing. This happens randomly and is quite painful for 5-10 minutes after then subsides. No specific activity brings it on other than standing up. Denies hx of injury/trauma to that knee. No swelling. Otherwise the knee does not bother her.    Review of Systems- no numbness/tingling in lower extremities, no muscle weakness     Objective:   Physical Exam Vitals reviewed  Gen: well nourished well developed in NAD  Feet: left foot with healed incisions from prior surgery. Right foot with bunion present. No swelling. Non-tender to palpation. Neurovascularly in tact.   Left knee: no soft tissue swelling or joint effusion, full ROM in flexion/extension of knee. Lateral tracking of patella with knee extension. When standing noticeable knee valgus bilaterally. Normal patellar stability. Strength normal. Neurovascularly in tact.    Assessment & Plan:   Right foot bunion- chronic, becoming bothersome to patient -referral made to Dr. Victorino DikeHewitt for evaluation -follow up as needed  Left knee pain- intermittent, likely secondary to VMO weakness and lateral patellar tracking with knee extension -advised patient to work with her trainer to strengthen VMO -follow up if symptoms worsen or fail to  improve  [name] PattyAngela Phuong Hillary, DO PGY-2,  Family Medicine 05/30/2017 3:47 PM   Patient seen and evaluated with the resident. I agree with the above plan of care. Patient had an excellent result with surgery on her left foot. She would be interested in surgery on her right foot if warranted. Dr. Victorino DikeHewitt did surgery on her left foot so I will refer her to him. Definitive treatment will be per his discretion. Patient will follow-up with me as needed.

## 2017-05-30 NOTE — Patient Instructions (Signed)
    It was great seeing you today! Dr. Margaretha Sheffieldraper will refer you to see Dr. Victorino DikeHewitt again for your right foot.  Ask your trainer to help you train your VMO, this will hopefully help with your lateral patellar tracking.   Dolores PattyAngela Anjelina Dung, DO PGY-2, [facility] 05/30/2017 3:31 PM

## 2017-06-02 ENCOUNTER — Other Ambulatory Visit: Payer: Self-pay

## 2017-06-02 DIAGNOSIS — M21612 Bunion of left foot: Principal | ICD-10-CM

## 2017-06-02 DIAGNOSIS — M21611 Bunion of right foot: Secondary | ICD-10-CM

## 2017-06-29 ENCOUNTER — Other Ambulatory Visit: Payer: Self-pay | Admitting: Obstetrics and Gynecology

## 2017-06-29 NOTE — Telephone Encounter (Signed)
Medication refill request: OCP  Last AEX:  08-12-16  Next AEX: 08-17-17  Last MMG (if hormonal medication request): 10-25-16 WNL  Refill authorized: please advise

## 2017-08-17 ENCOUNTER — Ambulatory Visit: Payer: Self-pay | Admitting: Obstetrics and Gynecology

## 2017-08-24 ENCOUNTER — Other Ambulatory Visit: Payer: Self-pay | Admitting: Obstetrics and Gynecology

## 2017-08-24 DIAGNOSIS — Z1231 Encounter for screening mammogram for malignant neoplasm of breast: Secondary | ICD-10-CM

## 2017-09-15 ENCOUNTER — Other Ambulatory Visit: Payer: Self-pay | Admitting: Obstetrics and Gynecology

## 2017-09-15 NOTE — Telephone Encounter (Signed)
Medication refill request: Zoloft Last AEX:  08-12-16  Next AEX: cancelled 08-17-17 AEX- Patient will call back to schedule per note Last MMG (if hormonal medication request): 10-25-16 WNL  Refill authorized: please advise

## 2017-09-15 NOTE — Telephone Encounter (Signed)
Spoke with regarding scheduling AEX. Patient was going into a meeting and says she will call back and schedule-eh

## 2017-09-15 NOTE — Telephone Encounter (Signed)
Please inform the patient that a one month refill has been sent and set her up for her annual exam

## 2017-09-22 ENCOUNTER — Other Ambulatory Visit: Payer: Self-pay | Admitting: Obstetrics and Gynecology

## 2017-10-27 ENCOUNTER — Ambulatory Visit: Payer: Managed Care, Other (non HMO)

## 2017-10-28 ENCOUNTER — Other Ambulatory Visit: Payer: Self-pay | Admitting: Obstetrics and Gynecology

## 2017-11-02 NOTE — Telephone Encounter (Signed)
Medication refill request: Sertraline and JUNEL FE Last AEX:  08/12/16 JJ Next AEX: 11/23/17 Last MMG (if hormonal medication request): 10/25/16 BIRADS 1  Negative/density b Refill authorized: Please advise on refills

## 2017-11-23 ENCOUNTER — Ambulatory Visit
Admission: RE | Admit: 2017-11-23 | Discharge: 2017-11-23 | Disposition: A | Discharge status: Home / Self Care | Payer: Managed Care, Other (non HMO) | Source: Ambulatory Visit | Attending: Obstetrics and Gynecology | Admitting: Obstetrics and Gynecology

## 2017-11-23 ENCOUNTER — Other Ambulatory Visit: Payer: Self-pay

## 2017-11-23 ENCOUNTER — Ambulatory Visit (INDEPENDENT_AMBULATORY_CARE_PROVIDER_SITE_OTHER): Payer: Managed Care, Other (non HMO) | Admitting: Obstetrics and Gynecology

## 2017-11-23 ENCOUNTER — Encounter: Payer: Self-pay | Admitting: Obstetrics and Gynecology

## 2017-11-23 VITALS — BP 116/70 | HR 80 | Resp 16 | Ht 67.0 in

## 2017-11-23 DIAGNOSIS — Z23 Encounter for immunization: Secondary | ICD-10-CM

## 2017-11-23 DIAGNOSIS — Z1231 Encounter for screening mammogram for malignant neoplasm of breast: Secondary | ICD-10-CM

## 2017-11-23 DIAGNOSIS — Z3009 Encounter for other general counseling and advice on contraception: Secondary | ICD-10-CM

## 2017-11-23 DIAGNOSIS — Z01419 Encounter for gynecological examination (general) (routine) without abnormal findings: Secondary | ICD-10-CM | POA: Diagnosis not present

## 2017-11-23 DIAGNOSIS — Z0000 Encounter for general adult medical examination without abnormal findings: Secondary | ICD-10-CM

## 2017-11-23 MED ORDER — NORETHIN ACE-ETH ESTRAD-FE 1-20 MG-MCG PO TABS: 1.0000 | ORAL_TABLET | Freq: Every day | ORAL | 0 refills | Status: DC

## 2017-11-23 MED ORDER — SERTRALINE HCL 50 MG PO TABS: 50.0000 mg | ORAL_TABLET | Freq: Every day | ORAL | 3 refills | Status: DC

## 2017-11-23 NOTE — Progress Notes (Signed)
52 y.o. [id number] MarriedCaucasianF here for annual exam.  She is on OCP's, doesn't get regular cycles, just off and on. She will just have light bleeding for 1/2 a day when she gets her cycle.  She has had night sweats for 20 years, random hot flashes, nothing has worsened. No vaginal dryness. Mood is fine. Husbands nephew died at 27 of an OD, Uncle died of a massive MI, cousin died, friend died all in the last year.  Daughter with OCD, seeing a psychiatrist, get medication adjustments. She is going to start grad school in data science. Living independently.  She has had a palpable lymph node on her right neck for years, no change    Patient's last menstrual period was 11/07/2017.          Sexually active: Yes.    The current method of family planning is OCP (estrogen/progesterone).    Exercising: Yes.    trainer and run/walk Smoker:  no  Health Maintenance: Pap:  07/16/15 WNL History of abnormal Pap:  Yes, Yes repeated in 6 mos was then WNL MMG:  10/25/16 BIRADS 1 negative/density b - scheduled today at [facility] Colonoscopy:  none BMD:   none TDaP:  07/08/2008 Gardasil: n/a   reports that  has never smoked. she has never used smokeless tobacco. She reports that she drinks about 3.5 - 7.0 oz of alcohol per week. She reports that she does not use drugs. She is a IT trainer, Administrator, [employer] (works part time). 2 daughters, both in their 47's, younger daughter with severe OCD. Older daughter is living in [place], just bought a house, doing great!  Past Medical History:  Diagnosis Date  . Anxiety     Past Surgical History:  Procedure Laterality Date  . AUGMENTATION MAMMAPLASTY Bilateral 10/99   revision 8/05  . FOOT SURGERY Left 07-30-14   reconstructive surgery     Current Outpatient Medications  Medication Sig Dispense Refill  . JUNEL FE 1/20 1-20 MG-MCG tablet TAKE 1 TABLET BY MOUTH DAILY. 28 tablet 0  . sertraline (ZOLOFT) 50 MG tablet TAKE 1 TABLET BY MOUTH EVERY DAY 30 tablet 0    No current facility-administered medications for this visit.     Family History  Problem Relation Age of Onset  . Colon cancer Paternal Grandfather   . Hyperlipidemia Mother   . Hyperlipidemia Father     Review of Systems  Constitutional: Negative.   HENT: Negative.   Eyes: Negative.   Respiratory: Negative.   Cardiovascular: Negative.   Gastrointestinal: Negative.   Endocrine: Negative.   Genitourinary: Negative.   Musculoskeletal: Negative.   Skin: Negative.   Allergic/Immunologic: Negative.   Neurological: Negative.   Hematological: Bruises/bleeds easily.       Swollen gland in neck  Psychiatric/Behavioral: Negative.     Exam:   BP 116/70 (BP Location: Right Arm, Patient Position: Sitting, Cuff Size: Normal)   Pulse 80   Resp 16   Ht 5\' 7"  (1.702 m)   LMP 11/07/2017   BMI 24.28 kg/m   Weight change: @WEIGHTCHANGE @ Height:   Height: 5\' 7"  (170.2 cm)  Ht Readings from Last 3 Encounters:  11/23/17 5\' 7"  (1.702 m)  05/30/17 5\' 7"  (1.702 m)  08/12/16 5\' 7"  (1.702 m)    General appearance: alert, cooperative and appears stated age Head: Normocephalic, without obvious abnormality, atraumatic Neck: no adenopathy, supple, symmetrical, trachea midline and thyroid normal to inspection and palpation Lungs: clear to auscultation bilaterally Cardiovascular: regular rate and rhythm Breasts:  normal appearance, no masses or tenderness Abdomen: soft, non-tender; non distended,  no masses,  no organomegaly Extremities: extremities normal, atraumatic, no cyanosis or edema Skin: Skin color, texture, turgor normal. No rashes or lesions Lymph nodes: supraclavicular, and axillary nodes normal. She has an under 1 cm palpable, smooth, mobile, not tender node in the right posterior cervical region (per patient no change for at least 5-6 years) No abnormal inguinal nodes palpated Neurologic: Grossly normal   Pelvic: External genitalia:  no lesions              Urethra:  normal  appearing urethra with no masses, tenderness or lesions              Bartholins and Skenes: normal                 Vagina: normal appearing vagina with normal color and discharge, no lesions              Cervix: no lesions               Bimanual Exam:  Uterus:  normal size, contour, position, consistency, mobility, non-tender              Adnexa: no mass, fullness, tenderness               Rectovaginal: Confirms               Anus:  normal sphincter tone, no lesions  Chaperone was present for exam.  A:  Well Woman with normal exam  P:   Continue Zoloft  Will check a FSH prior to starting her new OCP's  Screening labs today (fasting)  Discussed breast self exam  Discussed calcium and vit D intake  Mammogram today  Discussed colon cancer screening, desires cologuard  TDAP

## 2017-11-23 NOTE — Progress Notes (Signed)
Spoke with patient while in office. Cologuard form completed and faxed to [facility]. Patient aware she will be contacted directly by [facility]. Patient verbalizes understanding and is agreeable.

## 2017-11-23 NOTE — Patient Instructions (Signed)

## 2017-11-24 LAB — CBC
Hematocrit: 40.6 % (ref 34.0–46.6)
Hemoglobin: 13.9 g/dL (ref 11.1–15.9)
MCH: 31.7 pg (ref 26.6–33.0)
MCHC: 34.2 g/dL (ref 31.5–35.7)
MCV: 93 fL (ref 79–97)
PLATELETS: 265 10*3/uL (ref 150–379)
RBC: 4.38 x10E6/uL (ref 3.77–5.28)
RDW: 13.1 % (ref 12.3–15.4)
WBC: 5.2 10*3/uL (ref 3.4–10.8)

## 2017-11-24 LAB — LIPID PANEL
CHOLESTEROL TOTAL: 237 mg/dL — AB (ref 100–199)
Chol/HDL Ratio: 3.4 ratio (ref 0.0–4.4)
HDL: 69 mg/dL (ref >39)
LDL Calculated: 147 mg/dL — ABNORMAL HIGH (ref 0–99)
Triglycerides: 104 mg/dL (ref 0–149)
VLDL CHOLESTEROL CAL: 21 mg/dL (ref 5–40)

## 2017-11-24 LAB — COMPREHENSIVE METABOLIC PANEL
A/G RATIO: 1.8 (ref 1.2–2.2)
ALK PHOS: 51 IU/L (ref 39–117)
ALT: 10 IU/L (ref 0–32)
AST: 18 IU/L (ref 0–40)
Albumin: 4.4 g/dL (ref 3.5–5.5)
BILIRUBIN TOTAL: 0.5 mg/dL (ref 0.0–1.2)
BUN / CREAT RATIO: 14 (ref 9–23)
BUN: 13 mg/dL (ref 6–24)
CO2: 24 mmol/L (ref 20–29)
Calcium: 9.3 mg/dL (ref 8.7–10.2)
Chloride: 100 mmol/L (ref 96–106)
Creatinine, Ser: 0.91 mg/dL (ref 0.57–1.00)
GFR calc non Af Amer: 74 mL/min/{1.73_m2} (ref >59)
GFR, EST AFRICAN AMERICAN: 85 mL/min/{1.73_m2} (ref >59)
GLUCOSE: 82 mg/dL (ref 65–99)
Globulin, Total: 2.5 g/dL (ref 1.5–4.5)
POTASSIUM: 4.6 mmol/L (ref 3.5–5.2)
Sodium: 137 mmol/L (ref 134–144)
Total Protein: 6.9 g/dL (ref 6.0–8.5)

## 2017-12-21 ENCOUNTER — Other Ambulatory Visit: Payer: Self-pay | Admitting: Obstetrics and Gynecology

## 2017-12-21 NOTE — Telephone Encounter (Signed)
She is supposed to have an FSH drawn at the end of her placebo week. Will call in one more pack, while waiting for her to get her blood drawn. Please discuss with the patient

## 2017-12-21 NOTE — Telephone Encounter (Signed)
Medication refill request: junel fe/20 Last AEX:  1/19 Next AEX: not yet scheduled Last MMG (if hormonal medication request): 11-23-17 category b density birads 1:neg Refill authorized: note stated patient needing fsh lab before starting new ocps. Please refill or deny as appropriate.

## 2017-12-22 NOTE — Telephone Encounter (Signed)
Pt states she has been in another state since the end of January. Pt states she will be back here in 3 weeks & will have her blood work done while on her placebo pills.

## 2018-01-04 ENCOUNTER — Other Ambulatory Visit (INDEPENDENT_AMBULATORY_CARE_PROVIDER_SITE_OTHER): Payer: Managed Care, Other (non HMO)

## 2018-01-04 DIAGNOSIS — Z0000 Encounter for general adult medical examination without abnormal findings: Secondary | ICD-10-CM

## 2018-01-04 DIAGNOSIS — R232 Flushing: Secondary | ICD-10-CM

## 2018-01-05 LAB — FOLLICLE STIMULATING HORMONE: FSH: 42.7 m[IU]/mL

## 2018-01-19 ENCOUNTER — Telehealth: Payer: Self-pay

## 2018-01-19 NOTE — Telephone Encounter (Signed)
Called patient to clarify [facility] [place] [place]. Per chart patient was to stop OCPs x2 months and monitor bleeding and call with update. Spoke with patient and she states she did not make this request for refill. Declined refill on Junel. Patient to call with update.

## 2018-01-24 ENCOUNTER — Other Ambulatory Visit: Payer: Self-pay | Admitting: Obstetrics and Gynecology

## 2018-02-10 ENCOUNTER — Telehealth: Payer: Self-pay | Admitting: *Deleted

## 2018-02-10 NOTE — Telephone Encounter (Signed)
Spoke with patient in regards to cologuard. Patient states she has the kit at home and is still interested in doing the testing, but has been traveling recently and just been busy, but will complete.   Routing to provider for final review. Patient agreeable to disposition. Will close encounter.

## 2018-03-02 ENCOUNTER — Ambulatory Visit: Payer: Managed Care, Other (non HMO) | Admitting: Sports Medicine

## 2018-03-06 ENCOUNTER — Ambulatory Visit (INDEPENDENT_AMBULATORY_CARE_PROVIDER_SITE_OTHER): Payer: Managed Care, Other (non HMO) | Admitting: Sports Medicine

## 2018-03-06 ENCOUNTER — Encounter: Payer: Self-pay | Admitting: Sports Medicine

## 2018-03-06 VITALS — BP 118/80 | Ht 67.0 in | Wt 168.0 lb

## 2018-03-06 DIAGNOSIS — M21611 Bunion of right foot: Secondary | ICD-10-CM | POA: Diagnosis not present

## 2018-03-06 DIAGNOSIS — M21612 Bunion of left foot: Secondary | ICD-10-CM | POA: Diagnosis not present

## 2018-03-07 ENCOUNTER — Encounter: Payer: Self-pay | Admitting: Sports Medicine

## 2018-03-07 NOTE — Progress Notes (Signed)
   Subjective:    Patient ID: [name] Arroyo, female    DOB: [birth year], 52 y.o.   MRN: [id number]  HPI chief complaint: Right knee pain and right foot pain  Patient comes in today for possible custom orthotics. She has had custom orthotics in the past. They have been helpful. She has a bunion on the right foot and is planning on having this surgically corrected in early 2020. She has done well status post bunionectomy on the left. Since she is waiting to have her surgery, she is wondering whether new custom orthotics will be helpful. She is also complaining of some right knee pain which has been present for a few weeks. Main complaint is an inability to completely extend the knee. She has noticed intermittent swelling. She denies similar problems in the past.  Interim medical history reviewed Medications reviewed Allergies reviewed    Review of Systems    as above Objective:   Physical Exam  Well-developed, well-nourished. No acute distress. Awake alert and oriented 3. Vital signs reviewed  Examination of both feet in the standing position shows collapse of the transverse arches bilaterally, right greater than left. Well-healed surgical incision on the left consistent with previous bunionectomy. Obvious bunion on the right foot. No soft tissue swelling. Significant pronation with walking. Neurovascularly intact distally.  Right knee: Patient appears to have good range of motion. Trace effusion. Good joint stability. Slight tenderness to palpation along the medial joint line.      Assessment & Plan:   Right foot pain secondary to bunion Status post left bunionectomy-doing well Right knee pain  Custom orthotics were created for the patient today. I added metatarsal pads to try to better support her transverse arches. She found the orthotics to be comfortable prior to leaving the office. A total of 30 minutes was spent with the patient with greater than 50% of the time spent in  face-to-face consultation discussing orthotic construction, instruction, and fitting. Gait was neutralized with orthotics in place. Patient will follow-up with me in 4 weeks for reevaluation. We will see if her knee pain improves with today's orthotics. If not, we will work this up further. Call questions or concerns prior to follow-up visit.  Patient was fitted for a : standard, cushioned, semi-rigid orthotic. The orthotic was heated and afterward the patient stood on the orthotic blank positioned on the orthotic stand. The patient was positioned in subtalar neutral position and 10 degrees of ankle dorsiflexion in a weight bearing stance. After completion of molding, a stable base was applied to the orthotic blank. The blank was ground to a stable position for weight bearing. Size: 9 Base: Blue EVA Posting: none Additional orthotic padding: B/L metatarsal pads

## 2018-03-31 ENCOUNTER — Telehealth: Payer: Self-pay | Admitting: Obstetrics and Gynecology

## 2018-03-31 NOTE — Telephone Encounter (Signed)
Routing to Dr.Silva for review as Dr.Jertson is out of the office today.

## 2018-03-31 NOTE — Telephone Encounter (Signed)
Patient is asking to talk with Dr.Jertson's nurse about her hot flashes.

## 2018-03-31 NOTE — Telephone Encounter (Signed)
Please make an appointment for the patient to see Dr. Oscar La for an office visit to discussed options for treating menopausal symptoms.  She can then decide if she wants to do a progesterone withdrawal test.

## 2018-03-31 NOTE — Telephone Encounter (Signed)
Spoke with patient. Patient states that she stopped taking her Junel OCP at the beginning of March. FSH on 01/04/18 was 42.7. Has not had any bleeding since stopping OCP. Reports she is having hot flashes every hour. Feels extremely hot and breaks out in a full sweat when this occurs. Requesting something for symptom relief. Per note result note on 01/04/18 [facility] level patient may need course of Provera if no cycle after stopping OCP. Advised will review with Dr.Jertson and return call.

## 2018-04-04 ENCOUNTER — Ambulatory Visit
Admission: RE | Admit: 2018-04-04 | Discharge: 2018-04-04 | Disposition: A | Discharge status: Home / Self Care | Payer: Managed Care, Other (non HMO) | Source: Ambulatory Visit | Attending: Sports Medicine | Admitting: Sports Medicine

## 2018-04-04 ENCOUNTER — Ambulatory Visit (INDEPENDENT_AMBULATORY_CARE_PROVIDER_SITE_OTHER): Payer: Managed Care, Other (non HMO) | Admitting: Sports Medicine

## 2018-04-04 VITALS — BP 120/84 | Ht 67.0 in | Wt 165.0 lb

## 2018-04-04 DIAGNOSIS — G8929 Other chronic pain: Secondary | ICD-10-CM

## 2018-04-04 DIAGNOSIS — M25561 Pain in right knee: Principal | ICD-10-CM

## 2018-04-04 NOTE — Telephone Encounter (Signed)
I agree, I think she should come in to discuss options.

## 2018-04-04 NOTE — Telephone Encounter (Signed)
Spoke with patient. Appointment scheduled for 5/29 at 9 am with Dr.Jertson. Patient is agreeable to date and time. Encounter closed.

## 2018-04-05 ENCOUNTER — Other Ambulatory Visit: Payer: Self-pay

## 2018-04-05 ENCOUNTER — Encounter: Payer: Self-pay | Admitting: Obstetrics and Gynecology

## 2018-04-05 ENCOUNTER — Ambulatory Visit (INDEPENDENT_AMBULATORY_CARE_PROVIDER_SITE_OTHER): Payer: Managed Care, Other (non HMO) | Admitting: Obstetrics and Gynecology

## 2018-04-05 VITALS — BP 118/80 | HR 68 | Resp 16

## 2018-04-05 DIAGNOSIS — N951 Menopausal and female climacteric states: Secondary | ICD-10-CM | POA: Diagnosis not present

## 2018-04-05 DIAGNOSIS — N912 Amenorrhea, unspecified: Secondary | ICD-10-CM | POA: Diagnosis not present

## 2018-04-05 MED ORDER — MEDROXYPROGESTERONE ACETATE 5 MG PO TABS: ORAL_TABLET | ORAL | 1 refills | Status: DC

## 2018-04-05 NOTE — Patient Instructions (Signed)
Menopause and Herbal Products What is menopause? Menopause is the normal time of life when menstrual periods decrease in frequency and eventually stop completely. This process can take several years for some women. Menopause is complete when you have had an absence of menstruation for a full year since your last menstrual period. It usually occurs between the ages of 48 and 55. It is not common for menopause to begin before the age of 40. During menopause, your body stops producing the female hormones estrogen and progesterone. Common symptoms associated with this loss of hormones (vasomotor symptoms) are:  Hot flashes.  Hot flushes.  Night sweats.  Other common symptoms and complications of menopause include:  Decrease in sex drive.  Vaginal dryness and thinning of the walls of the vagina. This can make sex painful.  Dryness of the skin and development of wrinkles.  Headaches.  Tiredness.  Irritability.  Memory problems.  Weight gain.  Bladder infections.  Hair growth on the face and chest.  Inability to reproduce offspring (infertility).  Loss of density in the bones (osteoporosis) increasing your risk for breaks (fractures).  Depression.  Hardening and narrowing of the arteries (atherosclerosis). This increases your risk of heart attack and stroke.  What treatment options are available? There are many treatment choices for menopause symptoms. The most common treatment is hormone replacement therapy. Many alternative therapies for menopause are emerging, including the use of herbal products. These supplements can be found in the form of herbs, teas, oils, tinctures, and pills. Common herbal supplements for menopause are made from plants that contain phytoestrogens. Phytoestrogens are compounds that occur naturally in plants and plant products. They act like estrogen in the body. Foods and herbs that contain phytoestrogens include:  Soy.  Flax seeds.  Red  clover.  Ginseng.  What menopause symptoms may be helped if I use herbal products?  Vasomotor symptoms. These may be helped by: ? Soy. Some studies show that soy may have a moderate benefit for hot flashes. ? Black cohosh. There is limited evidence indicating this may be beneficial for hot flashes.  Symptoms that are related to heart and blood vessel disease. These may be helped by soy. Studies have shown that soy can help to lower cholesterol.  Depression. This may be helped by: ? St. John's wort. There is limited evidence that shows this may help mild to moderate depression. ? Black cohosh. There is evidence that this may help depression and mood swings.  Osteoporosis. Soy may help to decrease bone loss that is associated with menopause and may prevent osteoporosis. Limited evidence indicates that red clover may offer some bone loss protection as well. Other herbal products that are commonly used during menopause lack enough evidence to support their use as a replacement for conventional menopause therapies. These products include evening primrose, ginseng, and red clover. What are the cases when herbal products should not be used during menopause? Do not use herbal products during menopause without your health care provider's approval if:  You are taking medicine.  You have a preexisting liver condition.  Are there any risks in my taking herbal products during menopause? If you choose to use herbal products to help with symptoms of menopause, keep in mind that:  Different supplements have different and unmeasured amounts of herbal ingredients.  Herbal products are not regulated the same way that medicines are.  Concentrations of herbs may vary depending on the way they are prepared. For example, the concentration may be different in a pill,   tea, oil, and tincture.  Little is known about the risks of using herbal products, particularly the risks of long-term use.  Some herbal  supplements can be harmful when combined with certain medicines.  Most commonly reported side effects of herbal products are mild. However, if used improperly, many herbal supplements can cause serious problems. Talk to your health care provider before starting any herbal product. If problems develop, stop taking the supplement and let your health care provider know. This information is not intended to replace advice given to you by your health care provider. Make sure you discuss any questions you have with your health care provider. Document Released: 04/12/2008 Document Revised: 09/21/2016 Document Reviewed: 04/09/2014 Elsevier Interactive Patient Education  2017 [employer] Inc. Perimenopause Perimenopause is the time when your body begins to move into the menopause (no menstrual period for 12 straight months). It is a natural process. Perimenopause can begin 2-8 years before the menopause and usually lasts for 1 year after the menopause. During this time, your ovaries may or may not produce an egg. The ovaries vary in their production of estrogen and progesterone hormones each month. This can cause irregular menstrual periods, difficulty getting pregnant, vaginal bleeding between periods, and uncomfortable symptoms. What are the causes?  Irregular production of the ovarian hormones, estrogen and progesterone, and not ovulating every month. Other causes include:  Tumor of the pituitary gland in the brain.  Medical disease that affects the ovaries.  Radiation treatment.  Chemotherapy.  Unknown causes.  Heavy smoking and excessive alcohol intake can bring on perimenopause sooner.  What are the signs or symptoms?  Hot flashes.  Night sweats.  Irregular menstrual periods.  Decreased sex drive.  Vaginal dryness.  Headaches.  Mood swings.  Depression.  Memory problems.  Irritability.  Tiredness.  Weight gain.  Trouble getting pregnant.  The beginning of losing bone  cells (osteoporosis).  The beginning of hardening of the arteries (atherosclerosis). How is this diagnosed? Your health care provider will make a diagnosis by analyzing your age, menstrual history, and symptoms. He or she will do a physical exam and note any changes in your body, especially your female organs. Female hormone tests may or may not be helpful depending on the amount of female hormones you produce and when you produce them. However, other hormone tests may be helpful to rule out other problems. How is this treated? In some cases, no treatment is needed. The decision on whether treatment is necessary during the perimenopause should be made by you and your health care provider based on how the symptoms are affecting you and your lifestyle. Various treatments are available, such as:  Treating individual symptoms with a specific medicine for that symptom.  Herbal medicines that can help specific symptoms.  Counseling.  Group therapy.  Follow these instructions at home:  Keep track of your menstrual periods (when they occur, how heavy they are, how long between periods, and how long they last) as well as your symptoms and when they started.  Only take over-the-counter or prescription medicines as directed by your health care provider.  Sleep and rest.  Exercise.  Eat a diet that contains calcium (good for your bones) and soy (acts like the estrogen hormone).  Do not smoke.  Avoid alcoholic beverages.  Take vitamin supplements as recommended by your health care provider. Taking vitamin E may help in certain cases.  Take calcium and vitamin D supplements to help prevent bone loss.  Group therapy is sometimes helpful.  Acupuncture may help in some cases. Contact a health care provider if:  You have questions about any symptoms you are having.  You need a referral to a specialist (gynecologist, psychiatrist, or psychologist). Get help right away if:  You have vaginal  bleeding.  Your period lasts longer than 8 days.  Your periods are recurring sooner than 21 days.  You have bleeding after intercourse.  You have severe depression.  You have pain when you urinate.  You have severe headaches.  You have vision problems. This information is not intended to replace advice given to you by your health care provider. Make sure you discuss any questions you have with your health care provider. Document Released: 12/02/2004 Document Revised: 04/01/2016 Document Reviewed: 05/24/2013 Elsevier Interactive Patient Education  2017 [employer].

## 2018-04-05 NOTE — Progress Notes (Signed)
   Subjective:    Patient ID: [name] Arroyo, female    DOB: 1966-09-09, 52 y.o.   MRN: [id number]  HPI   Patient comes in today for follow-up. Her right foot pain has improved with her custom orthotics. However, she continues to complain of right knee pain. The pain started acutely while working out. This was several weeks ago. Since that time, she has had medial sided knee pain and an inability to completely extend the knee. She has been able to stay active despite the pain. No imaging has been done. No prior problems with this knee in the past. No prior knee surgeries.   Review of Systems    as above Objective:   Physical Exam  Well-developed, well-nourished. No acute distress. Awake alert and oriented 3. Vital signs reviewed  Right knee: Patient lacks about 5 of extension when compared to the uninvolved left knee. Full flexion. Trace effusion. She is tender to palpation along the medial joint line with a positive McMurray's. Positive Thessaly's. Knee is stable to valgus and varus stressing. Trace patellofemoral crepitus. Negative anterior drawer, negative posterior drawer. Neurovascularly intact distally.  X-rays of both knees including AP, lateral, and sunrise views are reviewed. There are mild tricompartmental degenerative changes in the left knee. Right knee is unremarkable.      Assessment & Plan:   Right knee pain and swelling-rule out meniscal tear versus loose body  MRI of the right knee specifically to rule out a medial meniscal tear or a loose body which may need arthroscopic intervention. Phone follow-up with those results when available. We will delineate further treatment based on those findings.

## 2018-04-05 NOTE — Progress Notes (Signed)
GYNECOLOGY  VISIT   HPI: 52 y.o.   Married  Caucasian  female   G2P2002 with No LMP recorded (lmp unknown). Patient is premenopausal.   here c/o menopausal symptoms including hot flashes since coming off her OCP at the end of February. She had an FSH of 42.7   She has had night sweats off and on for years. Not having sweats every night, but is up at least one x a night with a hot flashes. The hot flashes during the day are more bothersome. Gets 5-10 flashes a day.  She is a little more irritable, but is on Zoloft.   GYNECOLOGIC HISTORY: No LMP recorded (lmp unknown). Patient is premenopausal. Contraception:none  Menopausal hormone therapy: none         OB History    Gravida  2   Para  2   Term  2   Preterm      AB      Living  2     SAB      TAB      Ectopic      Multiple      Live Births  2              Patient Active Problem List   Diagnosis Date Noted  . Generalized anxiety disorder 07/16/2015  . 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 Abnormality of gait 05/09/2013  . Bilateral bunions 05/09/2013  . Plantar fasciitis 04/11/2013    Past Medical History:  Diagnosis Date  . Anxiety     Past Surgical History:  Procedure Laterality Date  . AUGMENTATION MAMMAPLASTY Bilateral 10/99   revision 8/05  . FOOT SURGERY Left 07-30-14   reconstructive surgery     Current Outpatient Medications  Medication Sig Dispense Refill  . sertraline (ZOLOFT) 50 MG tablet Take 1 tablet (50 mg total) by mouth daily. 90 tablet 3   No current facility-administered medications for this visit.      ALLERGIES: Patient has no known allergies.  Family History  Problem Relation Age of Onset  . Colon cancer Paternal Grandfather   . Hyperlipidemia Mother   . Hyperlipidemia Father     Social History   Socioeconomic History  . Marital status: Married    Spouse name: Not on file  . Number of children: 2  . Years of education: Not on file  . Highest  education level: Not on file  Occupational History  . Not on file  Social Needs  . Financial resource strain: Not on file  . Food insecurity:    Worry: Not on file    Inability: Not on file  . Transportation needs:    Medical: Not on file    Non-medical: Not on file  Tobacco Use  . Smoking status: Never Smoker  . Smokeless tobacco: Never Used  Substance and Sexual Activity  . Alcohol use: Yes    Alcohol/week: 3.5 - 7.0 oz    Types: 7 - 14 Standard drinks or equivalent per week  . Drug use: No  . Sexual activity: Yes    Partners: Male    Birth control/protection: Pill  Lifestyle  . Physical activity:    Days per week: Not on file    Minutes per session: Not on file  . Stress: Not on file  Relationships  . Social connections:    Talks on phone: Not on file    Gets together: Not on file    Attends religious service: Not  on file    Active member of club or organization: Not on file    Attends meetings of clubs or organizations: Not on file    Relationship status: Not on file  . Intimate partner violence:    Fear of current or ex partner: Not on file    Emotionally abused: Not on file    Physically abused: Not on file    Forced sexual activity: Not on file  Other Topics Concern  . Not on file  Social History Narrative  . Not on file    Review of Systems  Constitutional: Negative.   HENT: Negative.   Eyes: Negative.   Respiratory: Negative.   Cardiovascular: Negative.   Gastrointestinal: Negative.   Genitourinary:       Hot flahes  Musculoskeletal: Negative.   Skin: Negative.   Neurological: Negative.   Endo/Heme/Allergies: Negative.   Psychiatric/Behavioral: Negative.     PHYSICAL EXAMINATION:    BP 118/80 (BP Location: Right Arm, Patient Position: Sitting, Cuff Size: Normal)   Pulse 68   Resp 16   LMP  (LMP Unknown)     General appearance: alert, cooperative and appears stated age  ASSESSMENT Vasomotor symptoms Amenorrhea    PLAN Discussed  avoiding triggers, dressing in layers, sleep habits Can try Estroven or Estroven pm Discussed using lubricant if needed with intercourse and the option of vaginal estrogen Discussed the use of Gabapentin  Discussed the option of ERT, including the risks Will treat with cyclic provera   An After Visit Summary was printed and given to the patient.  ~15 minutes face to face time of which over 50% was spent in counseling.

## 2018-04-05 NOTE — Addendum Note (Signed)
Addended by: Rutha Bouchard E on: 04/05/2018 11:09 AM   Modules accepted: Orders

## 2018-06-06 ENCOUNTER — Ambulatory Visit: Payer: BLUE CROSS/BLUE SHIELD | Admitting: Sports Medicine

## 2018-06-06 VITALS — BP 122/78 | Ht 67.0 in | Wt 163.0 lb

## 2018-06-06 DIAGNOSIS — S4992XA Unspecified injury of left shoulder and upper arm, initial encounter: Secondary | ICD-10-CM

## 2018-06-06 MED ORDER — DICLOFENAC SODIUM 75 MG PO TBEC: DELAYED_RELEASE_TABLET | ORAL | 0 refills | Status: DC

## 2018-06-06 NOTE — Patient Instructions (Signed)
Acromioclavicular joint or AC joint, is a joint at the top of the shoulder. It is the junction between the acromion (part of the scapula that forms the highest point of the shoulder) and the clavicle.

## 2018-06-07 ENCOUNTER — Encounter: Payer: Self-pay | Admitting: Sports Medicine

## 2018-06-07 NOTE — Progress Notes (Signed)
   Subjective:    Patient ID: [name] Arroyo, female    DOB: 30-Nov-1965, 52 y.o.   MRN: [id number]  HPI chief complaint: Left shoulder pain  Patient comes in today complaining of 2 months of left shoulder pain. She does not recall any trauma. Localizes the pain to the superior shoulder. It is worse sleeping at night. She has also noticed some pain and swelling at the sternoclavicular joint. She denies any significant increase in activity but she is very active working out almost daily. She has taken some ibuprofen in the past with good symptom relief. She's not noticed any weakness. She is left-hand dominant.  Past medical history reviewed Medications reviewed Allergies reviewed    Review of Systems As above    Objective:   Physical Exam Well-developed, well-nourished. No acute distress. Awake alert and oriented 3. Vital signs reviewed  Left shoulder: Full range of motion. There is tenderness to palpation directly over the acromioclavicular joint. Positive cross arm adduction test. Rotator cuff strength is 5/5. Slightly positive empty can, negative Hawkins. No tenderness over the bicipital groove. There is also some mild swelling at the left sternoclavicular joint. It is not erythematous. Minimally tender to palpation. Neurovascular intact distally.  Bedside MSK of the left shoulder was performed. Limited images were obtained. There is minimal spurring as well as fluid at the acromioclavicular joint.Supraspinatus tendon appears to be intact. There is some fluid in the subacromial bursa. There is also fluid at the sternoclavicular joint.       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 Patient is instructed to avoid repetitive overhead lifting or pulling in the gym. She will limit her workouts to  shoulder level or lower. If symptoms persist or worsen, follow-up for reevaluation and further workup. Otherwise, follow-up as needed.

## 2018-07-02 ENCOUNTER — Inpatient Hospital Stay: Admission: RE | Admit: 2018-07-02 | Payer: BLUE CROSS/BLUE SHIELD | Source: Ambulatory Visit

## 2018-07-09 ENCOUNTER — Ambulatory Visit
Admission: RE | Admit: 2018-07-09 | Discharge: 2018-07-09 | Disposition: A | Discharge status: Home / Self Care | Payer: BLUE CROSS/BLUE SHIELD | Source: Ambulatory Visit | Attending: Sports Medicine | Admitting: Sports Medicine

## 2018-07-09 DIAGNOSIS — M25561 Pain in right knee: Secondary | ICD-10-CM

## 2018-07-12 ENCOUNTER — Telehealth: Payer: Self-pay | Admitting: Sports Medicine

## 2018-07-12 NOTE — Telephone Encounter (Signed)
I spoke with the patient on the phone today after reviewing the MRI of her right knee that I ordered in May. She has a longitudinal tear through the lateral meniscus. Some mild tricompartmental osteoarthritis. She continues to get mechanical symptoms and swelling. I recommended knee arthroscopy but [place]I've offered her a cortisone injection to help with her symptoms until she can get settled into her new home. She is currently out of town but will schedule a follow-up appointment with me upon returning and we will discuss this in greater detail.

## 2018-07-17 ENCOUNTER — Ambulatory Visit: Payer: BLUE CROSS/BLUE SHIELD | Admitting: Sports Medicine

## 2018-07-18 ENCOUNTER — Telehealth: Payer: Self-pay | Admitting: Obstetrics and Gynecology

## 2018-07-18 ENCOUNTER — Ambulatory Visit: Payer: BLUE CROSS/BLUE SHIELD | Admitting: Sports Medicine

## 2018-07-18 VITALS — BP 122/82 | Ht 67.0 in | Wt 165.0 lb

## 2018-07-18 DIAGNOSIS — S83281D Other tear of lateral meniscus, current injury, right knee, subsequent encounter: Secondary | ICD-10-CM

## 2018-07-18 MED ORDER — METHYLPREDNISOLONE ACETATE 40 MG/ML IJ SUSP
40.0000 mg | Freq: Once | INTRAMUSCULAR | Status: AC
  Administered 2018-07-18: 40 mg via INTRA_ARTICULAR

## 2018-07-18 NOTE — Telephone Encounter (Signed)
Patient has some questions for a nurse about her prescription. No further details given.

## 2018-07-18 NOTE — Telephone Encounter (Signed)
Spoke with patient. She has questions about the use of her provera prescription that was written by Dr. Oscar La in May  2019.  Discussed use, 1 tab po qd x 5 days q other month with no spontaneous menses. Discussed endometrial protection with cyclic provera and to calendar when she takes the provera and when/if she has a withdrawal bleed.  Patient thinks she may have had a cycle sometime after her last visit in may but is unsure.  She will call back to schedule her annual exam.  Clinical Advice given, routing to Dr. Oscar La to review and will close.

## 2018-07-19 ENCOUNTER — Other Ambulatory Visit: Payer: Self-pay

## 2018-07-19 DIAGNOSIS — S83281D Other tear of lateral meniscus, current injury, right knee, subsequent encounter: Secondary | ICD-10-CM

## 2018-07-19 NOTE — Progress Notes (Signed)
  Patient comes in today for a cortisone injection into her right knee. Please see the previous office visit and phone note for details regarding history and physical exam findings. In short, she has a longitudinal tear through the lateral meniscus. Mild tricompartmental osteoarthritis. She is in the process of moving to the [facility] and has an appointment to see an orthopedist out there. I discussed the possibility of a cortisone injection to help decrease her symptoms until she can be seen by orthopedics. She is in agreement with this. Right knee was injected today with an anterior lateral approach. This was done after risks and benefits were explained. Patient tolerated the procedure without difficulty. Follow-up with orthopedics in her new home town. She will take a copy of her MRI and radiology report with her. Follow-up with [place] as needed.  Consent obtained and verified. Time-out conducted. Noted no overlying erythema, induration, or other signs of local infection. Skin prepped in a sterile fashion. Topical analgesic spray: Ethyl chloride. Joint: right knee (anterior lateral) Needle: 25g 1.5 inch Completed without difficulty. Meds: 3cc 1% xylocaine, 1cc (40mg ) depomedrol  Advised to call if fevers/chills, erythema, induration, drainage, or persistent bleeding.

## 2018-07-24 ENCOUNTER — Other Ambulatory Visit: Payer: Self-pay

## 2018-07-24 ENCOUNTER — Telehealth: Payer: Self-pay

## 2018-07-24 DIAGNOSIS — S83281D Other tear of lateral meniscus, current injury, right knee, subsequent encounter: Secondary | ICD-10-CM

## 2018-07-24 MED ORDER — DICLOFENAC SODIUM 75 MG PO TBEC: DELAYED_RELEASE_TABLET | ORAL | 0 refills | Status: DC

## 2018-07-24 MED ORDER — TRAMADOL HCL 50 MG PO TABS: 50.0000 mg | ORAL_TABLET | Freq: Two times a day (BID) | ORAL | 0 refills | Status: DC | PRN

## 2018-07-24 NOTE — Telephone Encounter (Signed)
Referral faxed to Dr. Renaye Rakersim Murphy. Pt will call to schedule appt.

## 2018-07-24 NOTE — Telephone Encounter (Signed)
Referral placed to [facility] for pt to see Dr. Jerl Santosalldorf. Pt will call to schedule appt.

## 2018-07-26 ENCOUNTER — Encounter (HOSPITAL_BASED_OUTPATIENT_CLINIC_OR_DEPARTMENT_OTHER): Payer: Self-pay | Admitting: *Deleted

## 2018-07-27 ENCOUNTER — Encounter (HOSPITAL_BASED_OUTPATIENT_CLINIC_OR_DEPARTMENT_OTHER): Payer: Self-pay | Admitting: *Deleted

## 2018-07-27 ENCOUNTER — Other Ambulatory Visit: Payer: Self-pay

## 2018-07-27 NOTE — Progress Notes (Signed)
Spoke w/ pt via phone for pre-op interview.  Npo after mn.  Arrive at 0700.  Needs urine pre. May take tylenol if needed am dos.

## 2018-07-28 NOTE — H&P (Signed)
[facility]  [address]   [address] [place] [phone]   A Division of [facility]  RE: [name] Arroyo, [name]   [id number]      DOB: 1966/09/13  07-26-18  REASON FOR VISIT: Right knee injury.  HPI:  She first injured her knee in January doing cattle bell swings. She attempted conservative management, cortisone shot and activity modification but she continues to have pain. The MRI demonstrates a lateral anterior horn meniscus tear and a medial posterior horn meniscus tear with mild degenerative changes. She also has a history of patellar tendinosis but she is aware of this and feels this other pain is very different. She has an occasional popping and swelling of her knee.   EXAMINATION: Well appearing female in no apparent distress. She has tenderness over the anterior meniscus, lateral side. Minimal effusion. Posterior medial tenderness and a positive flexion pinch.   IMAGES: X-rays reviewed by me:   MRI results noted above.  ASSESSMENT & PLAN: I recommend a partial anterior lateral meniscectomy and posterior medial meniscectomy with chondroplasty.    Jewel Baizeimothy D.  Eulah PontMurphy, M.D.  Electronically verified by Jewel Baizeimothy D. Eulah PontMurphy, M.D. TDM::jgc D:   07-26-18 T:   07-27-18

## 2018-08-01 ENCOUNTER — Encounter (HOSPITAL_BASED_OUTPATIENT_CLINIC_OR_DEPARTMENT_OTHER)
Admission: RE | Disposition: A | Discharge status: Home / Self Care | Payer: Self-pay | Source: Ambulatory Visit | Attending: Orthopedic Surgery

## 2018-08-01 ENCOUNTER — Ambulatory Visit (HOSPITAL_BASED_OUTPATIENT_CLINIC_OR_DEPARTMENT_OTHER)
Admission: RE | Admit: 2018-08-01 | Discharge: 2018-08-01 | Disposition: A | Discharge status: Home / Self Care | Payer: BLUE CROSS/BLUE SHIELD | Source: Ambulatory Visit | Attending: Orthopedic Surgery | Admitting: Orthopedic Surgery

## 2018-08-01 ENCOUNTER — Ambulatory Visit (HOSPITAL_BASED_OUTPATIENT_CLINIC_OR_DEPARTMENT_OTHER): Payer: BLUE CROSS/BLUE SHIELD | Admitting: Anesthesiology

## 2018-08-01 ENCOUNTER — Encounter (HOSPITAL_BASED_OUTPATIENT_CLINIC_OR_DEPARTMENT_OTHER): Payer: Self-pay | Admitting: *Deleted

## 2018-08-01 ENCOUNTER — Other Ambulatory Visit: Payer: Self-pay

## 2018-08-01 DIAGNOSIS — S83281A Other tear of lateral meniscus, current injury, right knee, initial encounter: Secondary | ICD-10-CM | POA: Diagnosis not present

## 2018-08-01 DIAGNOSIS — M2241 Chondromalacia patellae, right knee: Secondary | ICD-10-CM | POA: Diagnosis not present

## 2018-08-01 DIAGNOSIS — S83241A Other tear of medial meniscus, current injury, right knee, initial encounter: Secondary | ICD-10-CM | POA: Diagnosis not present

## 2018-08-01 DIAGNOSIS — S83206A Unspecified tear of unspecified meniscus, current injury, right knee, initial encounter: Secondary | ICD-10-CM

## 2018-08-01 DIAGNOSIS — X58XXXA Exposure to other specified factors, initial encounter: Secondary | ICD-10-CM | POA: Diagnosis not present

## 2018-08-01 HISTORY — PX: KNEE ARTHROSCOPY WITH MEDIAL MENISECTOMY: SHX5651

## 2018-08-01 HISTORY — DX: Unspecified osteoarthritis, unspecified site: M19.90

## 2018-08-01 HISTORY — DX: Unspecified tear of unspecified meniscus, current injury, right knee, initial encounter: S83.206A

## 2018-08-01 LAB — POCT PREGNANCY, URINE: PREG TEST UR: NEGATIVE

## 2018-08-01 SURGERY — ARTHROSCOPY, KNEE, WITH MEDIAL MENISCECTOMY
Anesthesia: General | Site: Knee | Laterality: Right

## 2018-08-01 MED ORDER — ACETAMINOPHEN 500 MG PO TABS
1000.0000 mg | ORAL_TABLET | Freq: Once | ORAL | Status: AC
  Administered 2018-08-01: 1000 mg via ORAL
  Filled 2018-08-01: qty 2

## 2018-08-01 MED ORDER — MIDAZOLAM HCL 5 MG/5ML IJ SOLN
INTRAMUSCULAR | Status: DC | PRN
  Administered 2018-08-01: 2 mg via INTRAVENOUS

## 2018-08-01 MED ORDER — LIDOCAINE 2% (20 MG/ML) 5 ML SYRINGE
INTRAMUSCULAR | Status: AC
  Filled 2018-08-01: qty 5

## 2018-08-01 MED ORDER — CHLORHEXIDINE GLUCONATE 4 % EX LIQD
60.0000 mL | Freq: Once | CUTANEOUS | Status: DC
  Filled 2018-08-01: qty 118

## 2018-08-01 MED ORDER — KETOROLAC TROMETHAMINE 30 MG/ML IJ SOLN
INTRAMUSCULAR | Status: DC | PRN
  Administered 2018-08-01: 30 mg via INTRAVENOUS

## 2018-08-01 MED ORDER — METHYLPREDNISOLONE ACETATE 80 MG/ML IJ SUSP
80.0000 mg | Freq: Once | INTRAMUSCULAR | Status: DC
  Filled 2018-08-01: qty 1

## 2018-08-01 MED ORDER — METHYLPREDNISOLONE ACETATE 80 MG/ML IJ SUSP
INTRAMUSCULAR | Status: DC | PRN
  Administered 2018-08-01: 5 mL via INTRA_ARTICULAR

## 2018-08-01 MED ORDER — HYDROMORPHONE HCL 1 MG/ML IJ SOLN
INTRAMUSCULAR | Status: AC
  Filled 2018-08-01: qty 1

## 2018-08-01 MED ORDER — OXYCODONE HCL 5 MG/5ML PO SOLN
5.0000 mg | Freq: Once | ORAL | Status: AC | PRN
  Filled 2018-08-01: qty 5

## 2018-08-01 MED ORDER — HYDROMORPHONE HCL 1 MG/ML IJ SOLN
0.2500 mg | INTRAMUSCULAR | Status: DC | PRN
  Administered 2018-08-01 (×2): 0.5 mg via INTRAVENOUS
  Filled 2018-08-01: qty 0.5

## 2018-08-01 MED ORDER — DOCUSATE SODIUM 100 MG PO CAPS: 100.0000 mg | ORAL_CAPSULE | Freq: Two times a day (BID) | ORAL | 0 refills | Status: DC

## 2018-08-01 MED ORDER — DEXAMETHASONE SODIUM PHOSPHATE 4 MG/ML IJ SOLN
INTRAMUSCULAR | Status: DC | PRN
  Administered 2018-08-01: 10 mg via INTRAVENOUS

## 2018-08-01 MED ORDER — PROPOFOL 10 MG/ML IV BOLUS
INTRAVENOUS | Status: AC
  Filled 2018-08-01: qty 20

## 2018-08-01 MED ORDER — FENTANYL CITRATE (PF) 100 MCG/2ML IJ SOLN
INTRAMUSCULAR | Status: DC | PRN
  Administered 2018-08-01: 25 ug via INTRAVENOUS
  Administered 2018-08-01: 50 ug via INTRAVENOUS
  Administered 2018-08-01 (×3): 25 ug via INTRAVENOUS
  Administered 2018-08-01: 50 ug via INTRAVENOUS

## 2018-08-01 MED ORDER — PROPOFOL 10 MG/ML IV BOLUS
INTRAVENOUS | Status: DC | PRN
  Administered 2018-08-01: 200 mg via INTRAVENOUS

## 2018-08-01 MED ORDER — ONDANSETRON HCL 4 MG PO TABS: 4.0000 mg | ORAL_TABLET | Freq: Three times a day (TID) | ORAL | 0 refills | Status: DC | PRN

## 2018-08-01 MED ORDER — MIDAZOLAM HCL 2 MG/2ML IJ SOLN
INTRAMUSCULAR | Status: AC
  Filled 2018-08-01: qty 2

## 2018-08-01 MED ORDER — PROMETHAZINE HCL 25 MG/ML IJ SOLN
6.2500 mg | INTRAMUSCULAR | Status: DC | PRN
  Filled 2018-08-01: qty 1

## 2018-08-01 MED ORDER — OXYCODONE HCL 5 MG PO TABS
5.0000 mg | ORAL_TABLET | Freq: Once | ORAL | Status: AC | PRN
  Administered 2018-08-01: 5 mg via ORAL
  Filled 2018-08-01: qty 1

## 2018-08-01 MED ORDER — KETOROLAC TROMETHAMINE 30 MG/ML IJ SOLN
INTRAMUSCULAR | Status: AC
  Filled 2018-08-01: qty 1

## 2018-08-01 MED ORDER — DEXAMETHASONE SODIUM PHOSPHATE 10 MG/ML IJ SOLN
INTRAMUSCULAR | Status: AC
  Filled 2018-08-01: qty 1

## 2018-08-01 MED ORDER — GABAPENTIN 300 MG PO CAPS
ORAL_CAPSULE | ORAL | Status: AC
  Filled 2018-08-01: qty 1

## 2018-08-01 MED ORDER — MEPERIDINE HCL 25 MG/ML IJ SOLN
6.2500 mg | INTRAMUSCULAR | Status: DC | PRN
  Filled 2018-08-01: qty 1

## 2018-08-01 MED ORDER — HYDROCODONE-ACETAMINOPHEN 5-325 MG PO TABS: 1.0000 | ORAL_TABLET | Freq: Four times a day (QID) | ORAL | 0 refills | Status: DC | PRN

## 2018-08-01 MED ORDER — FENTANYL CITRATE (PF) 100 MCG/2ML IJ SOLN
INTRAMUSCULAR | Status: AC
  Filled 2018-08-01: qty 2

## 2018-08-01 MED ORDER — LACTATED RINGERS IV SOLN
INTRAVENOUS | Status: DC
  Filled 2018-08-01: qty 1000

## 2018-08-01 MED ORDER — ONDANSETRON HCL 4 MG/2ML IJ SOLN
INTRAMUSCULAR | Status: AC
  Filled 2018-08-01: qty 2

## 2018-08-01 MED ORDER — LACTATED RINGERS IV SOLN
INTRAVENOUS | Status: DC
  Administered 2018-08-01 (×2): via INTRAVENOUS
  Filled 2018-08-01: qty 1000

## 2018-08-01 MED ORDER — OXYCODONE HCL 5 MG PO TABS
ORAL_TABLET | ORAL | Status: AC
  Filled 2018-08-01: qty 1

## 2018-08-01 MED ORDER — SODIUM CHLORIDE 0.9 % IR SOLN
Status: DC | PRN
  Administered 2018-08-01 (×2): 3000 mL

## 2018-08-01 MED ORDER — ONDANSETRON HCL 4 MG/2ML IJ SOLN
INTRAMUSCULAR | Status: DC | PRN
  Administered 2018-08-01: 4 mg via INTRAVENOUS

## 2018-08-01 MED ORDER — CEFAZOLIN SODIUM-DEXTROSE 2-4 GM/100ML-% IV SOLN
INTRAVENOUS | Status: AC
  Filled 2018-08-01: qty 100

## 2018-08-01 MED ORDER — ACETAMINOPHEN 500 MG PO TABS
ORAL_TABLET | ORAL | Status: AC
  Filled 2018-08-01: qty 2

## 2018-08-01 MED ORDER — GABAPENTIN 300 MG PO CAPS
300.0000 mg | ORAL_CAPSULE | Freq: Once | ORAL | Status: AC
  Administered 2018-08-01: 300 mg via ORAL
  Filled 2018-08-01: qty 1

## 2018-08-01 MED ORDER — CEFAZOLIN SODIUM-DEXTROSE 2-4 GM/100ML-% IV SOLN
2.0000 g | INTRAVENOUS | Status: AC
  Administered 2018-08-01: 2 g via INTRAVENOUS
  Filled 2018-08-01: qty 100

## 2018-08-01 MED ORDER — LACTATED RINGERS IV SOLN
INTRAVENOUS | Status: DC
  Administered 2018-08-01: 10:00:00 via INTRAVENOUS
  Filled 2018-08-01: qty 1000

## 2018-08-01 MED ORDER — ASPIRIN EC 81 MG PO TBEC: 81.0000 mg | DELAYED_RELEASE_TABLET | Freq: Two times a day (BID) | ORAL | 0 refills | Status: DC

## 2018-08-01 MED ORDER — LIDOCAINE HCL (CARDIAC) PF 100 MG/5ML IV SOSY
PREFILLED_SYRINGE | INTRAVENOUS | Status: DC | PRN
  Administered 2018-08-01: 100 mg via INTRAVENOUS

## 2018-08-01 SURGICAL SUPPLY — 35 items
BANDAGE ACE 6X5 VEL STRL LF (GAUZE/BANDAGES/DRESSINGS) ×2 IMPLANT
BLADE CUDA 5.5 (BLADE) IMPLANT
BLADE CUDA GRT WHITE 3.5 (BLADE) ×2 IMPLANT
BLADE CUTTER GATOR 3.5 (BLADE) ×2 IMPLANT
BLADE CUTTER MENIS 5.5 (BLADE) IMPLANT
CHLORAPREP W/TINT 26ML (MISCELLANEOUS) ×2 IMPLANT
CUFF TOURNIQUET SINGLE 34IN LL (TOURNIQUET CUFF) ×2 IMPLANT
CUTTER MENISCUS  4.2MM (BLADE)
CUTTER MENISCUS 4.2MM (BLADE) IMPLANT
DRAPE ARTHROSCOPY W/POUCH 114 (DRAPES) ×2 IMPLANT
DRAPE IMP U-DRAPE 54X76 (DRAPES) ×2 IMPLANT
DRAPE U-SHAPE 47X51 STRL (DRAPES) ×2 IMPLANT
DRSG ADAPTIC 3X8 NADH LF (GAUZE/BANDAGES/DRESSINGS) ×2 IMPLANT
DRSG EMULSION OIL 3X3 NADH (GAUZE/BANDAGES/DRESSINGS) ×2 IMPLANT
GAUZE SPONGE 4X4 12PLY STRL (GAUZE/BANDAGES/DRESSINGS) ×2 IMPLANT
GLOVE BIO SURGEON STRL SZ7.5 (GLOVE) ×4 IMPLANT
GLOVE BIOGEL PI IND STRL 8 (GLOVE) ×2 IMPLANT
GLOVE BIOGEL PI IND STRL 8.5 (GLOVE) IMPLANT
GLOVE BIOGEL PI INDICATOR 8 (GLOVE) ×2
GLOVE BIOGEL PI INDICATOR 8.5 (GLOVE)
GOWN STRL REUS W/TWL LRG LVL3 (GOWN DISPOSABLE) ×6 IMPLANT
GOWN STRL REUS W/TWL XL LVL3 (GOWN DISPOSABLE) ×2 IMPLANT
KNEE WRAP E Z 3 GEL PACK (MISCELLANEOUS) ×2 IMPLANT
MANIFOLD NEPTUNE II (INSTRUMENTS) ×2 IMPLANT
PACK ARTHROSCOPY DSU (CUSTOM PROCEDURE TRAY) ×2 IMPLANT
PACK BASIN DAY SURGERY FS (CUSTOM PROCEDURE TRAY) ×2 IMPLANT
PADDING CAST COTTON 6X4 STRL (CAST SUPPLIES) ×2 IMPLANT
PROBE BIPOLAR ATHRO 135MM 90D (MISCELLANEOUS) IMPLANT
SET ARTHROSCOPY TUBING (MISCELLANEOUS) ×1
SET ARTHROSCOPY TUBING LN (MISCELLANEOUS) ×1 IMPLANT
SUT ETHILON 3 0 PS 1 (SUTURE) ×2 IMPLANT
TAPE CLOTH 3X10 TAN LF (GAUZE/BANDAGES/DRESSINGS) IMPLANT
TOWEL OR 17X24 6PK STRL BLUE (TOWEL DISPOSABLE) ×2 IMPLANT
TOWEL OR NON WOVEN STRL DISP B (DISPOSABLE) ×2 IMPLANT
WATER STERILE IRR 1000ML POUR (IV SOLUTION) ×2 IMPLANT

## 2018-08-01 NOTE — Op Note (Signed)
08/01/2018  1:16 PM  PATIENT:  [name] Arroyo    PRE-OPERATIVE DIAGNOSIS:  Chondromalcia patellae, right jknee, other tear of lateral meniscus, current injury, right knee, Other tear of medial mensicus current injury, right knee, Other tear of lateral meniscus  POST-OPERATIVE DIAGNOSIS:  Same  PROCEDURE:  RIGHT KNEE ARTHROSCOPY WITH MEDIAL MENISECTOMY, LATERAL MENISECTOMY AND CHONDRAPLASTY  SURGEON:  Sheral Apleyimothy D [facility], MD  ASSISTANT: Aquilla HackerHenry Martensen, PA-C, he was present and scrubbed throughout the case, critical for completion in a timely fashion, and for retraction, instrumentation, and closure.   ANESTHESIA:   General  BLOOD LOSS: min  COMPLICATIONS: None   PREOPERATIVE INDICATIONS:  [name] Arroyo is a  52 y.o. female with a diagnosis of Chondromalcia patellae, right jknee, other tear of lateral meniscus, current injury, right knee, Other tear of medial mensicus current injury, right knee, Other tear of lateral meniscus who failed conservative measures and elected for surgical management.    The risks benefits and alternatives were discussed with the patient preoperatively including but not limited to the risks of infection, bleeding, nerve injury, cardiopulmonary complications, the need for revision surgery, among others, and the patient was willing to proceed.  OPERATIVE IMPLANTS: none  OPERATIVE FINDINGS: Examination under anesthesia: stable Diagnostic Arthroscopy:  articular cartilage:grade 1 PF Medial meniscus:small tear Lateral meniscus:parrot beak tear anterior in white white Anterior cruciate ligament/PCL: stable Loose bodies: none    OPERATIVE PROCEDURE:  Patient was identified in the preoperative holding area and site was marked by me female was transported to the operating theater and placed on the table in supine position taking care to pad all bony prominences. After a preincinduction time out anesthesia was induced.  female received ancef for  preoperative antibiotics. The R lower extremity was prepped and draped in normal sterile fashion and a pre-incision timeout was performed.   A small stab incision was made in the anterolateral portal position. The arthroscope was introduced in the joint. A medial portal was then established under direct visualization just above the anterior horn of the medial meniscus. Diagnostic arthroscopy was then carried out with findings as described above.  I examined the entire knee and noted the above findings.   I debrided a small medial meniscal tear  I performed a chondroplasty in her posterior patella  I debrided a medium parrot beak tear from the ant lat meniscus. Prior to debridement it was confirmed to be white/white and displaced into the joint.   The arthroscopic equipment was removed from the joint and the portals were closed with 3-0 nylon in an interrupted fashion. The knee was infiltrated with depomedrol.  Sterile dressings were then applied including Xeroform 4 x 4's ABDs an ACE bandage.  The patient was then allowed to awaken from general anesthesia, transferred to the stretcher and taken to the recovery room in stable condition.  POSTOPERATIVE PLAN: The patient will be discharged home today and will followup in one week for suture removal and wound check.  VTE prophylaxis: ASA and mobilization

## 2018-08-01 NOTE — Anesthesia Preprocedure Evaluation (Addendum)
Anesthesia Evaluation  Patient identified by MRN, date of birth, ID band Patient awake    Reviewed: Allergy & Precautions, NPO status , Patient's Chart, lab work & pertinent test results  Airway Mallampati: I  TM Distance: >3 FB Neck ROM: Full    Dental  (+) Teeth Intact, Dental Advisory Given   Pulmonary    breath sounds clear to auscultation       Cardiovascular negative cardio ROS   Rhythm:Regular Rate:Normal     Neuro/Psych Anxiety    GI/Hepatic negative GI ROS, Neg liver ROS,   Endo/Other  negative endocrine ROS  Renal/GU negative Renal ROS     Musculoskeletal  (+) Arthritis ,   Abdominal Normal abdominal exam  (+)   Peds  Hematology negative hematology ROS (+)   Anesthesia Other Findings   Reproductive/Obstetrics                            Anesthesia Physical Anesthesia Plan  ASA: II  Anesthesia Plan: General   Post-op Pain Management:    Induction: Intravenous  PONV Risk Score and Plan: 4 or greater and Ondansetron, Dexamethasone, Midazolam and Scopolamine patch - Pre-op  Airway Management Planned: LMA  Additional Equipment: None  Intra-op Plan:   Post-operative Plan: Extubation in OR  Informed Consent: I have reviewed the patients History and Physical, chart, labs and discussed the procedure including the risks, benefits and alternatives for the proposed anesthesia with the patient or authorized representative who has indicated his/her understanding and acceptance.   Dental advisory given  Plan Discussed with: CRNA  Anesthesia Plan Comments:        Anesthesia Quick Evaluation

## 2018-08-01 NOTE — Transfer of Care (Signed)
Immediate Anesthesia Transfer of Care Note  Patient: [name] Arroyo  Procedure(s) Performed: Procedure(s) (LRB): RIGHT KNEE ARTHROSCOPY WITH MEDIAL MENISECTOMY, LATERAL MENISECTOMY AND CHONDRAPLASTY (Right)  Patient Location: PACU  Anesthesia Type: General  Level of Consciousness: awake, sedated, patient cooperative and responds to stimulation  Airway & Oxygen Therapy: Patient Spontanous Breathing and Patient connected to [place] O2  Post-op Assessment: Report given to PACU RN, Post -op Vital signs reviewed and stable and Patient moving all extremities  Post vital signs: Reviewed and stable  Complications: No apparent anesthesia complications

## 2018-08-01 NOTE — Anesthesia Procedure Notes (Signed)
Procedure Name: LMA Insertion Date/Time: 08/01/2018 11:55 AM Performed by: Jessica PriestBeeson, Mckenzee Beem C, CRNA Pre-anesthesia Checklist: Patient identified, Emergency Drugs available, Suction available and Patient being monitored Patient Re-evaluated:Patient Re-evaluated prior to induction Oxygen Delivery Method: Circle system utilized Preoxygenation: Pre-oxygenation with 100% oxygen Induction Type: IV induction Ventilation: Mask ventilation without difficulty LMA: LMA inserted LMA Size: 4.0 Number of attempts: 1 Airway Equipment and Method: Bite block Placement Confirmation: positive ETCO2 and breath sounds checked- equal and bilateral Tube secured with: Tape Dental Injury: Teeth and Oropharynx as per pre-operative assessment

## 2018-08-01 NOTE — Interval H&P Note (Signed)
History and Physical Interval Note:  08/01/2018 9:08 AM  [name] Arroyo  has presented today for surgery, with the diagnosis of Chondromalcia patellae, right jknee, other tear of lateral meniscus, current injury, right knee, Other tear of medial mensicus current injury, right knee, Other tear of lateral meniscus  The various methods of treatment have been discussed with the patient and family. After consideration of risks, benefits and other options for treatment, the patient has consented to  Procedure(s): RIGHT KNEE ARTHROSCOPY WITH MEDIAL MENISECTOMY, LATERAL MENISECTOMY VS REPAIR  **LINVATEC MENISCUS REPAIR KIT** (Right) RIGHT KNEE ARTHROSCOPY WITH LATERAL MENISECTOMY (Right) as a surgical intervention .  The patient's history has been reviewed, patient examined, no change in status, stable for surgery.  I have reviewed the patient's chart and labs.  Questions were answered to the patient's satisfaction.     [name]

## 2018-08-01 NOTE — Discharge Instructions (Signed)
Do not take any nonsteroidal anti inflammatories until after 6:45pm today.  Keep leg elevated with ice to reduce pain and swelling. You may increase pain medication up to 2 tablets every 4 hours for the first few days post op if needed.  Stop pain medicine as soon as you are able.  Diet: As you were doing prior to hospitalization   Dressing:  You may remove dressings and shower over incisions 3 days after surgery.  Place clean Band-Aid over incisions.   Activity:  Increase activity slowly as tolerated, but follow the weight bearing instructions below.  The rules on driving is that you can not be taking narcotics while you drive, and you must feel in control of the vehicle.    Weight Bearing: As tolerated   To prevent constipation: you may use a stool softener such as -  Colace (over the counter) 100 mg by mouth twice a day  Drink plenty of fluids (prune juice may be helpful) and high fiber foods Miralax (over the counter) for constipation as needed.    Itching:  If you experience itching with your medications, try taking only a single pain pill, or even half a pain pill at a time.  You can also use benadryl over the counter for itching or also to help with sleep.   Precautions:  If you experience chest pain or shortness of breath - call 911 immediately for transfer to the hospital emergency department!!  If you develop a fever greater that 101 F, purulent drainage from wound, increased redness or drainage from wound, or calf pain -- Call the office at [phone]                                                 Follow- Up Appointment:  Please call for an appointment to be seen in 2 weeks [place] - [phone]     Post Anesthesia Home Care Instructions  Activity: Get plenty of rest for the remainder of the day. A responsible individual must stay with you for 24 hours following the procedure.  For the next 24 hours, DO NOT: -Drive a car -Advertising copywriter -Drink alcoholic  beverages -Take any medication unless instructed by your physician -Make any legal decisions or sign important papers.  Meals: Start with liquid foods such as gelatin or soup. Progress to regular foods as tolerated. Avoid greasy, spicy, heavy foods. If nausea and/or vomiting occur, drink only clear liquids until the nausea and/or vomiting subsides. Call your physician if vomiting continues.  Special Instructions/Symptoms: Your throat may feel dry or sore from the anesthesia or the breathing tube placed in your throat during surgery. If this causes discomfort, gargle with warm salt water. The discomfort should disappear within 24 hours.  If you had a scopolamine patch placed behind your ear for the management of post- operative nausea and/or vomiting:  1. The medication in the patch is effective for 72 hours, after which it should be removed.  Wrap patch in a tissue and discard in the trash. Wash hands thoroughly with soap and water. 2. You may remove the patch earlier than 72 hours if you experience unpleasant side effects which may include dry mouth, dizziness or visual disturbances. 3. Avoid touching the patch. Wash your hands with soap and water after contact with the patch.     Call your surgeon if  you experience:   1.  Fever over 101.0. 2.  Inability to urinate. 3.  Nausea and/or vomiting. 4.  Extreme swelling or bruising at the surgical site. 5.  Continued bleeding from the incision. 6.  Increased pain, redness or drainage from the incision. 7.  Problems related to your pain medication. 8.  Any problems and/or concerns    NO IBUPROFEN PRODUCTS (MOTRIN, ADVIL) OR ALEVE UNTIL 6:45PM TODAY.

## 2018-08-02 ENCOUNTER — Encounter (HOSPITAL_BASED_OUTPATIENT_CLINIC_OR_DEPARTMENT_OTHER): Payer: Self-pay | Admitting: Orthopedic Surgery

## 2018-08-02 NOTE — Anesthesia Postprocedure Evaluation (Signed)
Anesthesia Post Note  Patient: [name] Arroyo  Procedure(s) Performed: RIGHT KNEE ARTHROSCOPY WITH MEDIAL MENISECTOMY, LATERAL MENISECTOMY AND CHONDRAPLASTY (Right Knee)     Patient location during evaluation: PACU Anesthesia Type: General Level of consciousness: awake and alert Pain management: pain level controlled Vital Signs Assessment: post-procedure vital signs reviewed and stable Respiratory status: spontaneous breathing, nonlabored ventilation, respiratory function stable and patient connected to nasal cannula oxygen Cardiovascular status: blood pressure returned to baseline and stable Postop Assessment: no apparent nausea or vomiting Anesthetic complications: no    Last Vitals:  Vitals:   08/01/18 1415 08/01/18 1430  BP: (!) 143/92   Pulse: (!) 58 64  Resp: 15 15  Temp:  36.8 C  SpO2: 100% 98%    Last Pain:  Vitals:   08/01/18 1430  TempSrc:   PainSc: 4                  Shelton SilvasKevin D Cambelle Suchecki

## 2018-08-23 ENCOUNTER — Telehealth: Payer: Self-pay | Admitting: Obstetrics and Gynecology

## 2018-08-23 MED ORDER — SERTRALINE HCL 50 MG PO TABS: 50.0000 mg | ORAL_TABLET | Freq: Every day | ORAL | 0 refills | Status: DC

## 2018-08-23 NOTE — Telephone Encounter (Signed)
Pt returned call.  She cannot transfer [facility] rx as there are no [facility] locations [place] in [place]. Advised new Rx for 90 days has been sent to [facility]. If insurance does not allow she can cash pay for 7 pills and then pick up rx when insurance allows. Pt agreeable.  Message to Dr. Oscar La and will close.

## 2018-08-23 NOTE — Telephone Encounter (Signed)
Order was written for 90 day supply initially. [facility] has been dispensing in 30 day increments as reviewed in Epic. Last dispense, [facility]/29/18 [facility] [place].  Rx transferred to [facility] per patient request 90 day supply of zoloft.  Call to patient and left message to call back to clarify this is what she needs.

## 2018-08-23 NOTE — Telephone Encounter (Signed)
Patient would like Sertraline prescription sent to [facility] on [address] in [place]. On auto refill for Oct 21. She only has 2 left due to moving and would like a 7 day supply called in if possible. Would also like 90 day supply instead of 30.

## 2019-01-23 ENCOUNTER — Other Ambulatory Visit: Payer: Self-pay | Admitting: Obstetrics and Gynecology

## 2019-01-30 ENCOUNTER — Other Ambulatory Visit: Payer: Self-pay | Admitting: Obstetrics and Gynecology

## 2019-01-30 NOTE — Telephone Encounter (Signed)
Patient check status of refill request.

## 2019-01-30 NOTE — Telephone Encounter (Signed)
Spoke with patient, has 12 pills left, currently living in [place]. Patient does not plan to transfer her care at this time, will plan to schedule AEX with Dr. Oscar La after Covid-19. Confirmed MMG on file. Advised Dr. Oscar La will review, our office will notify of recommendations. Patient agreeable.    Med refill request: Zoloft 50mg  tab po daily Last AEX: 11/23/17 Next AEX: not scheduled Last MMG : 11/23/17:Bi-rads 1 neg Last Filled 08/23/18 #90/0RF  Routing to Dr. Oscar La to review and advise.

## 2019-02-05 ENCOUNTER — Other Ambulatory Visit: Payer: Self-pay | Admitting: Obstetrics and Gynecology

## 2019-04-11 ENCOUNTER — Other Ambulatory Visit: Payer: Self-pay | Admitting: Obstetrics and Gynecology

## 2019-04-11 DIAGNOSIS — Z1231 Encounter for screening mammogram for malignant neoplasm of breast: Secondary | ICD-10-CM

## 2019-04-23 ENCOUNTER — Other Ambulatory Visit: Payer: Self-pay

## 2019-04-23 ENCOUNTER — Ambulatory Visit: Payer: BC Managed Care – PPO | Admitting: Sports Medicine

## 2019-04-23 ENCOUNTER — Encounter: Payer: Self-pay | Admitting: Obstetrics and Gynecology

## 2019-04-23 ENCOUNTER — Ambulatory Visit (INDEPENDENT_AMBULATORY_CARE_PROVIDER_SITE_OTHER): Payer: BC Managed Care – PPO | Admitting: Obstetrics and Gynecology

## 2019-04-23 ENCOUNTER — Other Ambulatory Visit (HOSPITAL_COMMUNITY)
Admission: RE | Admit: 2019-04-23 | Discharge: 2019-04-23 | Disposition: A | Discharge status: Home / Self Care | Payer: BC Managed Care – PPO | Source: Ambulatory Visit | Attending: Obstetrics and Gynecology | Admitting: Obstetrics and Gynecology

## 2019-04-23 VITALS — BP 124/82 | HR 80 | Temp 98.0°F | Ht 66.54 in

## 2019-04-23 DIAGNOSIS — Z1211 Encounter for screening for malignant neoplasm of colon: Secondary | ICD-10-CM

## 2019-04-23 DIAGNOSIS — R269 Unspecified abnormalities of gait and mobility: Secondary | ICD-10-CM | POA: Diagnosis not present

## 2019-04-23 DIAGNOSIS — Z01419 Encounter for gynecological examination (general) (routine) without abnormal findings: Secondary | ICD-10-CM | POA: Diagnosis not present

## 2019-04-23 DIAGNOSIS — Z124 Encounter for screening for malignant neoplasm of cervix: Secondary | ICD-10-CM | POA: Insufficient documentation

## 2019-04-23 DIAGNOSIS — N898 Other specified noninflammatory disorders of vagina: Secondary | ICD-10-CM | POA: Diagnosis not present

## 2019-04-23 DIAGNOSIS — Z0000 Encounter for general adult medical examination without abnormal findings: Secondary | ICD-10-CM

## 2019-04-23 MED ORDER — PROGESTERONE MICRONIZED 100 MG PO CAPS: 100.0000 mg | ORAL_CAPSULE | Freq: Every day | ORAL | 1 refills | Status: DC

## 2019-04-23 MED ORDER — ESTRADIOL 0.025 MG/24HR TD PTTW: 1.0000 | MEDICATED_PATCH | TRANSDERMAL | 1 refills | Status: DC

## 2019-04-23 MED ORDER — SERTRALINE HCL 50 MG PO TABS: 50.0000 mg | ORAL_TABLET | Freq: Every day | ORAL | 3 refills | Status: DC

## 2019-04-23 NOTE — Patient Instructions (Signed)
EXERCISE AND DIET:  We recommended that you start or continue a regular exercise program for good health. Regular exercise means any activity that makes your heart beat faster and makes you sweat.  We recommend exercising at least 30 minutes per day at least 3 days a week, preferably 4 or 5.  We also recommend a diet low in fat and sugar.  Inactivity, poor dietary choices and obesity can cause diabetes, heart attack, stroke, and kidney damage, among others.    ALCOHOL AND SMOKING:  Women should limit their alcohol intake to no more than 7 drinks/beers/glasses of wine (combined, not each!) per week. Moderation of alcohol intake to this level decreases your risk of breast cancer and liver damage. And of course, no recreational drugs are part of a healthy lifestyle.  And absolutely no smoking or even second hand smoke. Most people know smoking can cause heart and lung diseases, but did you know it also contributes to weakening of your bones? Aging of your skin?  Yellowing of your teeth and nails?  CALCIUM AND VITAMIN D:  Adequate intake of calcium and Vitamin D are recommended.  The recommendations for exact amounts of these supplements seem to change often, but generally speaking 1,200 mg of calcium (between diet and supplement) and 800 units of Vitamin D per day seems prudent. Certain women may benefit from higher intake of Vitamin D.  If you are among these women, your doctor will have told you during your visit.    PAP SMEARS:  Pap smears, to check for cervical cancer or precancers,  have traditionally been done yearly, although recent scientific advances have shown that most women can have pap smears less often.  However, every woman still should have a physical exam from her gynecologist every year. It will include a breast check, inspection of the vulva and vagina to check for abnormal growths or skin changes, a visual exam of the cervix, and then an exam to evaluate the size and shape of the uterus and  ovaries.  And after 52 years of age, a rectal exam is indicated to check for rectal cancers. We will also provide age appropriate advice regarding health maintenance, like when you should have certain vaccines, screening for sexually transmitted diseases, bone density testing, colonoscopy, mammograms, etc.   MAMMOGRAMS:  All women over 40 years old should have a yearly mammogram. Many facilities now offer a "3D" mammogram, which may cost around $50 extra out of pocket. If possible,  we recommend you accept the option to have the 3D mammogram performed.  It both reduces the number of women who will be called back for extra views which then turn out to be normal, and it is better than the routine mammogram at detecting truly abnormal areas.    COLON CANCER SCREENING: Now recommend starting at age 45. At this time colonoscopy is not covered for routine screening until 50. There are take home tests that can be done between 45-49.   COLONOSCOPY:  Colonoscopy to screen for colon cancer is recommended for all women at age 50.  We know, you hate the idea of the prep.  We agree, BUT, having colon cancer and not knowing it is worse!!  Colon cancer so often starts as a polyp that can be seen and removed at colonscopy, which can quite literally save your life!  And if your first colonoscopy is normal and you have no family history of colon cancer, most women don't have to have it again for   10 years.  Once every ten years, you can do something that may end up saving your life, right?  We will be happy to help you get it scheduled when you are ready.  Be sure to check your insurance coverage so you understand how much it will cost.  It may be covered as a preventative service at no cost, but you should check your particular policy.      Breast Self-Awareness Breast self-awareness means being familiar with how your breasts look and feel. It involves checking your breasts regularly and reporting any changes to your  health care provider. Practicing breast self-awareness is important. A change in your breasts can be a sign of a serious medical problem. Being familiar with how your breasts look and feel allows you to find any problems early, when treatment is more likely to be successful. All women should practice breast self-awareness, including women who have had breast implants. How to do a breast self-exam One way to learn what is normal for your breasts and whether your breasts are changing is to do a breast self-exam. To do a breast self-exam: Look for Changes  1. Remove all the clothing above your waist. 2. Stand in front of a mirror in a room with good lighting. 3. Put your hands on your hips. 4. Push your hands firmly downward. 5. Compare your breasts in the mirror. Look for differences between them (asymmetry), such as: ? Differences in shape. ? Differences in size. ? Puckers, dips, and bumps in one breast and not the other. 6. Look at each breast for changes in your skin, such as: ? Redness. ? Scaly areas. 7. Look for changes in your nipples, such as: ? Discharge. ? Bleeding. ? Dimpling. ? Redness. ? A change in position. Feel for Changes Carefully feel your breasts for lumps and changes. It is best to do this while lying on your back on the floor and again while sitting or standing in the shower or tub with soapy water on your skin. Feel each breast in the following way:  Place the arm on the side of the breast you are examining above your head.  Feel your breast with the other hand.  Start in the nipple area and make  inch (2 cm) overlapping circles to feel your breast. Use the pads of your three middle fingers to do this. Apply light pressure, then medium pressure, then firm pressure. The light pressure will allow you to feel the tissue closest to the skin. The medium pressure will allow you to feel the tissue that is a little deeper. The firm pressure will allow you to feel the tissue  close to the ribs.  Continue the overlapping circles, moving downward over the breast until you feel your ribs below your breast.  Move one finger-width toward the center of the body. Continue to use the  inch (2 cm) overlapping circles to feel your breast as you move slowly up toward your collarbone.  Continue the up and down exam using all three pressures until you reach your armpit.  Write Down What You Find  Write down what is normal for each breast and any changes that you find. Keep a written record with breast changes or normal findings for each breast. By writing this information down, you do not need to depend only on memory for size, tenderness, or location. Write down where you are in your menstrual cycle, if you are still menstruating. If you are having trouble noticing differences   in your breasts, do not get discouraged. With time you will become more familiar with the variations in your breasts and more comfortable with the exam. How often should I examine my breasts? Examine your breasts every month. If you are breastfeeding, the best time to examine your breasts is after a feeding or after using a breast pump. If you menstruate, the best time to examine your breasts is 5-7 days after your period is over. During your period, your breasts are lumpier, and it may be more difficult to notice changes. When should I see my health care provider? See your health care provider if you notice:  A change in shape or size of your breasts or nipples.  A change in the skin of your breast or nipples, such as a reddened or scaly area.  Unusual discharge from your nipples.  A lump or thick area that was not there before.  Pain in your breasts.  Anything that concerns you.  Menopause and Hormone Replacement Therapy What is hormone replacement therapy?  Hormone replacement therapy (HRT) is the use of artificial (synthetic) hormones to replace hormones that your body stops producing  during menopause. Menopause is the normal time of life when menstrual periods stop completely and the ovaries stop producing the female hormones estrogen and progesterone. This lack of hormones can affect your health and cause undesirable symptoms. HRT can relieve some of those symptoms. What are my options for HRT? HRT may consist of the synthetic hormones estrogen and progestin, or it may consist of only estrogen (estrogen-only therapy). You and your health care provider will decide which form of HRT is best for you. If you choose to be on HRT and you have a uterus, estrogen and progestin are usually prescribed. Estrogen-only therapy is used for women who do not have a uterus. Possible options for taking HRT include:  Pills.  Patches.  Gels.  Sprays.  Vaginal cream.  Vaginal rings.  Vaginal inserts. The amount of hormone(s) that you take and how long you take the hormone(s) varies depending on your individual health. It is important to:  Begin HRT with the lowest possible dosage.  Stop HRT as soon as your health care provider tells you to stop.  Work with your health care provider so that you feel informed and comfortable with your decisions. What are the benefits of HRT? HRT can reduce the frequency and severity of menopausal symptoms. Benefits of HRT vary depending on the menopausal symptoms that you have, the severity of your symptoms, and your overall health. HRT may help to improve the following menopausal symptoms:  Hot flashes and night sweats. These are sudden feelings of heat that spread over the face and body. The skin may turn red, like a blush. Night sweats are hot flashes that happen while you are sleeping or trying to sleep.  Bone loss (osteoporosis). The body loses calcium more quickly after menopause, causing the bones to become weaker. This can increase the risk for bone breaks (fractures).  Vaginal dryness. The lining of the vagina can become thin and dry, which  can cause pain during sexual intercourse or cause infection, burning, or itching.  Urinary tract infections.  Urinary incontinence. This is a decreased ability to control when you urinate.  Irritability.  Short-term memory problems. What are the risks of HRT? Risks of HRT vary depending on your individual health and medical history. Risks of HRT also depend on whether you receive both estrogen and progestin or you receive estrogen only.HRT   may increase the risk of:  Spotting. This is when a small amount of bloodleaks from the vagina unexpectedly.  Endometrial cancer. This cancer is in the lining of the uterus (endometrium).  Breast cancer.  Increased density of breast tissue. This can make it harder to find breast cancer on a breast X-ray (mammogram).  Stroke.  Heart attack.  Blood clots.  Gallbladder disease. Risks of HRT can increase if you have any of the following conditions:  Endometrial cancer.  Liver disease.  Heart disease.  Breast cancer.  History of blood clots.  History of stroke. How should I care for myself while I am on HRT?  Take over-the-counter and prescription medicines only as told by your health care provider.  Get mammograms, pelvic exams, and medical checkups as often as told by your health care provider.  Have Pap tests done as often as told by your health care provider. A Pap test is sometimes called a Pap smear. It is a screening test that is used to check for signs of cancer of the cervix and vagina. A Pap test can also identify the presence of infection or precancerous changes. Pap tests may be done: ? Every 3 years, starting at age 21. ? Every 5 years, starting after age 30, in combination with testing for human papillomavirus (HPV). ? More often or less often depending on other medical conditions you have, your age, and other risk factors.  It is your responsibility to get your Pap test results. Ask your health care provider or the  department performing the test when your results will be ready.  Keep all follow-up visits as told by your health care provider. This is important. When should I seek medical care? Talk with your health care provider if:  You have any of these: ? Pain or swelling in your legs. ? Shortness of breath. ? Chest pain. ? Lumps or changes in your breasts or armpits. ? Slurred speech. ? Pain, burning, or bleeding when you urine.  You develop any of these: ? Unusual vaginal bleeding. ? Dizziness or headaches. ? Weakness or numbness in any part of your arms or legs. ? Pain in your abdomen. This information is not intended to replace advice given to you by your health care provider. Make sure you discuss any questions you have with your health care provider. Document Released: 07/24/2003 Document Revised: 06/09/2017 Document Reviewed: 04/28/2015 Elsevier Interactive Patient Education  2019 Elsevier Inc.  

## 2019-04-23 NOTE — Progress Notes (Signed)
  Patient comes in today for new custom orthotics.  She is status post right knee arthroscopy for meniscectomy.  She struggled postoperatively for quite some time but she is now doing much better.  She has regained a lot of strength in her hips and lower legs I working with her physical therapist and Physiological scientist.  She has changed running shoes and would like new orthotics for them.  Custom orthotics were created for her today.  We had previously put metatarsal pads on her orthotics but I am not sure that she needs them.  We did provide her with a pair today and instructed her in the proper way to place them on her orthotic if she found them to be necessary.  Patient found her orthotics to be comfortable prior to leaving the office.  Follow-up as needed.  Patient was fitted for a : standard, cushioned, semi-rigid orthotic. The orthotic was heated and afterward the patient stood on the orthotic blank positioned on the orthotic stand. The patient was positioned in subtalar neutral position and 10 degrees of ankle dorsiflexion in a weight bearing stance. After completion of molding, a stable base was applied to the orthotic blank. The blank was ground to a stable position for weight bearing. Size: 9 Base: Blue EVA Posting: none Additional orthotic padding: none

## 2019-04-23 NOTE — Progress Notes (Signed)
53 y.o. [id number] Married Other or two or more races Not Hispanic or Latino female here for annual exam.  Has been taking Provera every other month if no menses. Ended last dose 04/10/2019, no bleeding since. Reports she has not had any bleeding last 4 times she took Provera. She hasn't bleed for 10-12 months. Wants to talk about HRT. She c/o hot flashes, night sweats, disturbed sleep, weight gain. Still has some brain fog. Mood changes.  Sexually active, low libido, no dyspareunia.   She is working on eating healthy. She is exercising.   She is on Zoloft, there has been a lot of stress with Covid.   Patient moved to [place] in October. She is here to move her daughter out of graduate school. Will transfer her care to [place].  She had knee surgery in the fall. Still healing, has orthotics, working on strengthening the legs.    No LMP recorded. Patient is premenopausal.          Sexually active: Yes.    The current method of family planning is none.    Exercising: Yes.    training twice a week Smoker:  No  Health Maintenance: Pap:  07/16/15 WNL History of abnormal Pap:  Yes, Yesrepeated in 6 mos was then WNL  MMG:  11/23/2017 Birads 1 negative, trying to get an appointment Colonoscopy:  None BMD:   None TDaP:  11/23/2017 Gardasil: n/a    reports that she has never smoked. She has never used smokeless tobacco. She reports current alcohol use of about 7.0 - 14.0 standard drinks of alcohol per week. She reports that she does not use drugs. 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 Husband is Teacher, English as a foreign languageCEO.   Past Medical History:  Diagnosis Date  . Anxiety   . Arthritis   . Right knee meniscal tear     Past Surgical History:  Procedure Laterality Date  . AUGMENTATION MAMMAPLASTY Bilateral 08/1998  . BREAST SURGERY Bilateral 06/2004   revision breast augmentation  . FOOT SURGERY Left 07-30-14   reconstructive surgery   . KNEE ARTHROSCOPY WITH MEDIAL MENISECTOMY Right 08/01/2018   Procedure:  RIGHT KNEE ARTHROSCOPY WITH MEDIAL MENISECTOMY, LATERAL MENISECTOMY AND CHONDRAPLASTY;  Surgeon: Sheral ApleyMurphy, Timothy D, MD;  [facility];  Service: Orthopedics;  Laterality: Right;    Current Outpatient Medications  Medication Sig Dispense Refill  . medroxyPROGESTERone (PROVERA) 5 MG tablet Take 1 tablet a day now and every and every other month if no spontaneous menses 15 tablet 1  . sertraline (ZOLOFT) 50 MG tablet TAKE 1 TABLET BY MOUTH ONCE DAILY 90 tablet 0   No current facility-administered medications for this visit.     Family History  Problem Relation Age of Onset  . Colon cancer Paternal Grandfather   . Hyperlipidemia Mother   . Hyperlipidemia Father     Review of Systems  Constitutional: Negative.   HENT: Negative.   Eyes: Negative.   Respiratory: Negative.   Cardiovascular: Negative.   Gastrointestinal: Negative.   Endocrine: Negative.   Genitourinary:       Hot flashes Night sweats  Musculoskeletal: Negative.   Skin: Negative.   Allergic/Immunologic: Negative.   Neurological: Negative.   Hematological: Negative.   Psychiatric/Behavioral: Positive for sleep disturbance.    Exam:   BP 124/82 (BP Location: Right Arm, Patient Position: Sitting, Cuff Size: Large)   Pulse 80   Temp 98 F (36.7 C) (Skin)   Ht 5' 6.54" (1.69 m)   BMI  27.00 kg/m   Weight change: @WEIGHTCHANGE @ Height:   Height: 5' 6.54" (169 cm)  Ht Readings from Last 3 Encounters:  04/23/19 5' 6.54" (1.69 m)  04/23/19 5\' 7"  (1.702 m)  08/01/18 5\' 7"  (1.702 m)    General appearance: alert, cooperative and appears stated age Head: Normocephalic, without obvious abnormality, atraumatic Neck: no adenopathy, supple, symmetrical, trachea midline and thyroid normal to inspection and palpation Lungs: clear to auscultation bilaterally Cardiovascular: regular rate and rhythm Breasts: normal appearance, no masses or tenderness, bilateral implants Abdomen: soft, non-tender; non  distended,  no masses,  no organomegaly Extremities: extremities normal, atraumatic, no cyanosis or edema Skin: Skin color, texture, turgor normal. No rashes or lesions Lymph nodes: Cervical, supraclavicular, and axillary nodes normal. No abnormal inguinal nodes palpated Neurologic: Grossly normal   Pelvic: External genitalia:  no lesions              Urethra:  normal appearing urethra with no masses, tenderness or lesions              Bartholins and Skenes: normal                 Vagina: mildly atrophic appearing vagina with an increase in thin, yellow vaginal d/c (on questioning the patient reports and intermittent odor)              Cervix: no lesions               Bimanual Exam:  Uterus:  normal size, contour, position, consistency, mobility, non-tender              Adnexa: no mass, fullness, tenderness               Rectovaginal: Confirms               Anus:  normal sphincter tone, no lesions  Chaperone was present for exam.  A:  Well Woman with normal exam  Menopausal vasomotor, discussed options for treatment, including: behavioral changes, SSRI (she is already on one), gabapentin and HRT   P:   Mammogram, she will schedule  Cologuard ordered  Discussed breast self exam  Discussed calcium and vit D intake  Return for fasting labs.   Will start HRT, she will either call for a webex visit in a month or f/u with GYN in [place].   Risks of HRT reviewed

## 2019-04-25 ENCOUNTER — Telehealth: Payer: Self-pay | Admitting: Obstetrics and Gynecology

## 2019-04-25 ENCOUNTER — Telehealth: Payer: Self-pay

## 2019-04-25 ENCOUNTER — Other Ambulatory Visit: Payer: Self-pay

## 2019-04-25 ENCOUNTER — Other Ambulatory Visit (INDEPENDENT_AMBULATORY_CARE_PROVIDER_SITE_OTHER): Payer: BC Managed Care – PPO

## 2019-04-25 DIAGNOSIS — Z0000 Encounter for general adult medical examination without abnormal findings: Secondary | ICD-10-CM

## 2019-04-25 LAB — VAGINITIS/VAGINOSIS, DNA PROBE
Candida Species: NEGATIVE
Gardnerella vaginalis: POSITIVE — AB
Trichomonas vaginosis: NEGATIVE

## 2019-04-25 MED ORDER — METRONIDAZOLE 500 MG PO TABS: 500.0000 mg | ORAL_TABLET | Freq: Two times a day (BID) | ORAL | 0 refills | Status: DC

## 2019-04-25 MED ORDER — METRONIDAZOLE 0.75 % VA GEL: 1.0000 | Freq: Every day | VAGINAL | 0 refills | Status: AC

## 2019-04-25 NOTE — Telephone Encounter (Signed)
Patient is asking to make an "adjustment to her prescription.". She talked with Verline Lema earlier about this prescription.

## 2019-04-25 NOTE — Telephone Encounter (Signed)
Spoke with patient. Advised of results as seen below. Patient verbalizes understanding. Rx for Flagyl 500 mg BID x 7 days #14 0 RF sent to pharmacy on file. Avoid alcohol during treatment and 24 hours after completing medication. Don't mix with alcohol if mixed can cause severe nausea, vomiting and abdominal cramping. Patient verbalizes understanding. Encounter closed.

## 2019-04-25 NOTE — Telephone Encounter (Signed)
-----   Message from Salvadore Dom, MD sent at 04/25/2019 12:01 PM EDT ----- Please inform the patient that her vaginitis probe was + for BV and treat with flagyl (either oral or vaginal, her choice), no ETOH while on Flagyl.  Oral: Flagyl 500 mg BID x 7 days, or Vaginal: Metrogel, 1 applicator per vagina q day x 5 days.

## 2019-04-25 NOTE — Telephone Encounter (Signed)
Spoke with patient, request Rx for Metrogel instead of Flagyl PO. Patient request RX to [facility]. Advised I will cancel Rx [facility] Flag[facility] at [facility] [place]. Patient verbalizes understanding and is agreeable.   [facility]   Rx cancelled for flagyl. Call placed to [facility] [place], spoke with Tammy.   Routing to provider for final review. Patient is agreeable to disposition. Will close encounter.

## 2019-04-26 LAB — COMPREHENSIVE METABOLIC PANEL
ALT: 12 IU/L (ref 0–32)
AST: 19 IU/L (ref 0–40)
Albumin/Globulin Ratio: 1.7 (ref 1.2–2.2)
Albumin: 4.3 g/dL (ref 3.8–4.9)
Alkaline Phosphatase: 90 IU/L (ref 39–117)
BUN/Creatinine Ratio: 21 (ref 9–23)
BUN: 19 mg/dL (ref 6–24)
Bilirubin Total: 0.4 mg/dL (ref 0.0–1.2)
CO2: 23 mmol/L (ref 20–29)
Calcium: 9.6 mg/dL (ref 8.7–10.2)
Chloride: 100 mmol/L (ref 96–106)
Creatinine, Ser: 0.91 mg/dL (ref 0.57–1.00)
GFR calc Af Amer: 84 mL/min/{1.73_m2} (ref >59)
GFR calc non Af Amer: 73 mL/min/{1.73_m2} (ref >59)
Globulin, Total: 2.5 g/dL (ref 1.5–4.5)
Glucose: 112 mg/dL — ABNORMAL HIGH (ref 65–99)
Potassium: 4.8 mmol/L (ref 3.5–5.2)
Sodium: 141 mmol/L (ref 134–144)
Total Protein: 6.8 g/dL (ref 6.0–8.5)

## 2019-04-26 LAB — CYTOLOGY - PAP
Diagnosis: NEGATIVE
HPV: NOT DETECTED

## 2019-04-26 LAB — LIPID PANEL
Chol/HDL Ratio: 3.1 ratio (ref 0.0–4.4)
Cholesterol, Total: 265 mg/dL — ABNORMAL HIGH (ref 100–199)
HDL: 85 mg/dL (ref >39)
LDL Calculated: 159 mg/dL — ABNORMAL HIGH (ref 0–99)
Triglycerides: 104 mg/dL (ref 0–149)
VLDL Cholesterol Cal: 21 mg/dL (ref 5–40)

## 2019-04-26 LAB — CBC
Hematocrit: 42.6 % (ref 34.0–46.6)
Hemoglobin: 14.4 g/dL (ref 11.1–15.9)
MCH: 32.1 pg (ref 26.6–33.0)
MCHC: 33.8 g/dL (ref 31.5–35.7)
MCV: 95 fL (ref 79–97)
Platelets: 250 10*3/uL (ref 150–450)
RBC: 4.48 x10E6/uL (ref 3.77–5.28)
RDW: 12.4 % (ref 11.7–15.4)
WBC: 3.6 10*3/uL (ref 3.4–10.8)

## 2019-04-28 LAB — SPECIMEN STATUS REPORT

## 2019-04-28 LAB — HGB A1C W/O EAG: Hgb A1c MFr Bld: 5.5 % (ref 4.8–5.6)

## 2019-05-14 ENCOUNTER — Other Ambulatory Visit: Payer: Self-pay | Admitting: Obstetrics and Gynecology

## 2019-05-14 NOTE — Telephone Encounter (Signed)
Erroneous encounter

## 2019-05-16 ENCOUNTER — Other Ambulatory Visit: Payer: Self-pay | Admitting: Obstetrics and Gynecology

## 2019-06-13 ENCOUNTER — Other Ambulatory Visit: Payer: Self-pay | Admitting: Obstetrics and Gynecology

## 2019-06-13 NOTE — Telephone Encounter (Signed)
Medication refill request: estradiol 0.025 patch Last AEX:  04-23-2019 Next AEX: not scheduled. Last MMG (if hormonal medication request): 11-23-17 birads 1:neg Refill authorized: per aex note, patient was to call the office to schedule a webex visit in 1 mth or f/u with GYN in [place]. Left message letting patient know that rx was denied & that if she wanted to schedule a webex to call our office.

## 2019-06-15 ENCOUNTER — Other Ambulatory Visit: Payer: Self-pay | Admitting: Obstetrics and Gynecology

## 2019-06-18 ENCOUNTER — Telehealth: Payer: Self-pay | Admitting: Obstetrics and Gynecology

## 2019-06-18 NOTE — Telephone Encounter (Signed)
Patient is returning a call to Kaitlynm. °

## 2019-06-19 ENCOUNTER — Other Ambulatory Visit: Payer: Self-pay | Admitting: Obstetrics and Gynecology

## 2019-06-19 NOTE — Telephone Encounter (Signed)
Please see refill encounter dated 06/13/2019. Patient states that she has moved to [place] and has been unable to schedule an appointment with a GYN. Patient is currently using Estradiol 0.025 mg patch twice weekly and taking Progesterone 100 mg daily and reports she is not having any problems. Asking if Dr.Jertson can send in one more month of refills until she can get an appointment with a GYN. Having trouble getting a new provider. Advised may need Webex recheck. Will speak with Dr.Jertson and return call.

## 2019-06-19 NOTE — Telephone Encounter (Signed)
That's fine, send in one more month with one refill. I was going to send it, but didn't see a pharmacy in [place].

## 2019-06-20 MED ORDER — PROGESTERONE MICRONIZED 100 MG PO CAPS: 100.0000 mg | ORAL_CAPSULE | Freq: Every day | ORAL | 0 refills | Status: DC

## 2019-06-20 MED ORDER — ESTRADIOL 0.025 MG/24HR TD PTTW: 1.0000 | MEDICATED_PATCH | TRANSDERMAL | 0 refills | Status: DC

## 2019-06-20 NOTE — Telephone Encounter (Signed)
Left detailed message at number provided, okay per ROI. Advised Estradiol patch and Progesterone have been sent to [facility] in [place] that was on file for 1 month supply. Advised to return call with any additional questions. Encounter closed.

## 2019-07-08 ENCOUNTER — Telehealth: Payer: Self-pay | Admitting: Obstetrics and Gynecology

## 2019-07-08 NOTE — Telephone Encounter (Signed)
Mychart message sent reminding her to do the cologuard test or call if she prefers a different option.

## 2019-07-30 ENCOUNTER — Other Ambulatory Visit: Payer: Self-pay | Admitting: Obstetrics and Gynecology

## 2019-07-30 NOTE — Telephone Encounter (Signed)
Medication refill request: [name] Arroyo Last AEX:  04/23/2019 JJ Next AEX: none Last MMG (if hormonal medication request): 11/23/2017 BIRADS 1 Negative Density B Refill authorized: Pending #8 with 9 refills if appropriate. Please advise.   Medication refill request: Prometrium Last AEX:  04/23/2019 JJ Next AEX: none Last MMG (if hormonal medication request): 11/23/2017 BIRADS 1 Negative Density B Refill authorized: Pending #30 with 9 refills if appropriate. Please advise.

## 2019-07-31 NOTE — Telephone Encounter (Signed)
The patient is supposed to be establishing care in [place].  

## 2019-07-31 NOTE — Telephone Encounter (Signed)
Left message to call Maxie Debose at [phone]. 

## 2019-08-02 ENCOUNTER — Other Ambulatory Visit: Payer: Self-pay
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
## 2019-08-02 NOTE — Telephone Encounter (Signed)
Medication refill request: Prometrium Last AEX:  04/23/2019 JJ Next AEX: none Last MMG (if hormonal medication request): 11/23/2017 BIRADS 1 Negative Density B Refill authorized: Pending #30 with 8 refills if appropriate. Please advise.

## 2019-08-02 NOTE — Telephone Encounter (Signed)
Patient is returning call to Kaitlyn.  

## 2019-08-02 NOTE — Telephone Encounter (Signed)
Patient notified of refills being sent to pharmacy. Appointment scheduled for 08/07/2019 at 2:30 pm as I did not realize Dr.Jertson is out of the office tomorrow and is here next Friday instead. Patient is agreeable to new appointment date and time. Encounter closed.

## 2019-08-02 NOTE — Telephone Encounter (Signed)
The patient is supposed to be establishing care in [place].

## 2019-08-07 ENCOUNTER — Encounter: Payer: Self-pay | Admitting: Obstetrics and Gynecology

## 2019-08-07 ENCOUNTER — Telehealth (INDEPENDENT_AMBULATORY_CARE_PROVIDER_SITE_OTHER): Payer: BC Managed Care – PPO | Admitting: Obstetrics and Gynecology

## 2019-08-07 DIAGNOSIS — Z79890 Hormone replacement therapy (postmenopausal): Secondary | ICD-10-CM
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
## 2019-08-10 ENCOUNTER — Telehealth: Payer: BC Managed Care – PPO | Admitting: Obstetrics and Gynecology

## 2019-08-29 ENCOUNTER — Other Ambulatory Visit: Payer: Self-pay | Admitting: Obstetrics and Gynecology

## 2019-08-29 ENCOUNTER — Telehealth: Payer: Self-pay | Admitting: Obstetrics and Gynecology

## 2019-08-29 MED ORDER — ESTRADIOL 0.025 MG/24HR TD PTTW: MEDICATED_PATCH | TRANSDERMAL | 2 refills | Status: AC

## 2019-08-29 MED ORDER — PROGESTERONE MICRONIZED 100 MG PO CAPS: 100.0000 mg | ORAL_CAPSULE | Freq: Every day | ORAL | 2 refills | Status: AC

## 2019-08-29 NOTE — Telephone Encounter (Signed)
Please let the patient know that I got her mammogram report and ordered 3 month supplies of her patch and prometrium, with 2 refills.

## 2019-08-30 NOTE — Telephone Encounter (Signed)
Left detailed message with patient, okay per ROI. Advised of message as seen below from [place]. Encounter closed.
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
## 2019-10-06 LAB — COLOGUARD: Cologuard: NEGATIVE

## 2019-10-29 ENCOUNTER — Encounter: Payer: Self-pay | Admitting: Obstetrics and Gynecology

## 2019-10-29 ENCOUNTER — Telehealth: Payer: Self-pay | Admitting: Obstetrics and Gynecology

## 2019-10-29 DIAGNOSIS — Z1211 Encounter for screening for malignant neoplasm of colon: Secondary | ICD-10-CM

## 2019-10-29 NOTE — Telephone Encounter (Signed)
Cologuard results not received to date.   Call placed to [facility] at [phone], spoke with [facility]. Will fax Cologuard results to [facility].

## 2019-10-29 NOTE — Telephone Encounter (Signed)
Cologuard results received: report date 10/06/19, negative.   Patient notified of negative results.  Order and results placed in Epic. Copy of results to scan.   Routing to provider for final review. Patient is agreeable to disposition. Will close encounter.

## 2019-10-29 NOTE — Telephone Encounter (Signed)
Patient sent the following message through [facility]. Routing to triage to assist patient with request.  Shealyn, Sean Clinical Pool  Phone Number: [phone]  Good morning,   [facility] notified me over a week ago that they sent Cologuard results to Dr Talbert Nan. There is no info on my test result on this website.

## 2020-05-10 ENCOUNTER — Other Ambulatory Visit: Payer: Self-pay | Admitting: Obstetrics and Gynecology

## 2020-05-13 NOTE — Telephone Encounter (Signed)
Medication refill request: Zoloft  Last AEX: 04/23/19   Next AEX: none note sent to pharmacy advising patient to schedule.  Last MMG (if hormonal medication request): 08/24/19 Bi-rads 2 benign  Refill authorized: #30 with 0 RF
